# Patient Record
Sex: Female | Born: 2000 | Hispanic: Yes | Marital: Single | Smoking: Never smoker
Health system: Southern US, Community
[De-identification: ages and names within clinical notes are randomized; demographics above are authoritative.]

## PROBLEM LIST (undated history)

## (undated) DIAGNOSIS — J45909 Unspecified asthma, uncomplicated: Secondary | ICD-10-CM

---
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
## 2018-01-06 ENCOUNTER — Encounter: Payer: Self-pay | Admitting: Emergency Medicine

## 2018-01-06 ENCOUNTER — Emergency Department
Admission: EM | Admit: 2018-01-06 | Discharge: 2018-01-06 | Disposition: A | Discharge status: Home / Self Care | Payer: Medicaid Other | Attending: Emergency Medicine | Admitting: Emergency Medicine

## 2018-01-06 DIAGNOSIS — L309 Dermatitis, unspecified: Secondary | ICD-10-CM | POA: Insufficient documentation

## 2018-01-06 DIAGNOSIS — R21 Rash and other nonspecific skin eruption: Secondary | ICD-10-CM | POA: Diagnosis present

## 2018-01-06 DIAGNOSIS — J45909 Unspecified asthma, uncomplicated: Secondary | ICD-10-CM | POA: Diagnosis not present

## 2018-01-06 HISTORY — DX: Unspecified asthma, uncomplicated: J45.909

## 2018-01-06 MED ORDER — TRIAMCINOLONE ACETONIDE 0.025 % EX OINT: 1.0000 "application " | TOPICAL_OINTMENT | Freq: Two times a day (BID) | CUTANEOUS | 0 refills | Status: DC

## 2018-01-06 NOTE — ED Triage Notes (Signed)
Patient presents to ED via POV from home with c/o rash to eyes, face and arms x 2 weeks. Denies SOB or difficulty breathing. History of eczema.

## 2018-01-06 NOTE — ED Provider Notes (Signed)
[facility] Emergency Department Provider Note  ____________________________________________  Time seen: Approximately 3:49 PM  I have reviewed the triage vital signs and the nursing notes.   HISTORY  Chief Complaint Rash    HPI Molly Tyler is a 17 y.o. female presents to the emergency department with eczema along the flexural surfaces of the bilateral upper extremities and face.  Patient reports a history of eczema.  Patient reports that she has been prescribed other creams but has not been using them.  No alleviating measures have been attempted.   Past Medical History:  Diagnosis Date  . Asthma     There are no active problems to display for this patient.   History reviewed. No pertinent surgical history.  Prior to Admission medications   Medication Sig Start Date End Date Taking? Authorizing Provider  triamcinolone (KENALOG) 0.025 % ointment Apply 1 application topically 2 (two) times daily. 01/06/18   Orvil FeilWoods, Courtney Fenlon M, PA-C    Allergies Penicillins  No family history on file.  Social History Social History   Tobacco Use  . Smoking status: Never Smoker  . Smokeless tobacco: Never Used  Substance Use Topics  . Alcohol use: Never    Frequency: Never  . Drug use: Never     Review of Systems  Constitutional: No fever/chills Eyes: No visual changes. No discharge ENT: No upper respiratory complaints. Cardiovascular: no chest pain. Respiratory: no cough. No SOB. Gastrointestinal: No abdominal pain.  No nausea, no vomiting.  No diarrhea.  No constipation. Genitourinary: Negative for dysuria. No hematuria Musculoskeletal: Negative for musculoskeletal pain. Skin: Patient has a dry, erythematous rash along the flexural surfaces of the arms and face.   Neurological: Negative for headaches, focal weakness or numbness.   ____________________________________________   PHYSICAL EXAM:  VITAL SIGNS: ED Triage Vitals  Enc  Vitals Group     BP 01/06/18 1502 125/76     Pulse Rate 01/06/18 1502 62     Resp 01/06/18 1502 16     Temp 01/06/18 1502 98.4 F (36.9 C)     Temp Source 01/06/18 1502 Oral     SpO2 01/06/18 1502 98 %     Weight 01/06/18 1500 180 lb (81.6 kg)     Height 01/06/18 1500 5\' 3"  (1.6 m)     Head Circumference --      Peak Flow --      Pain Score 01/06/18 1500 0     Pain Loc --      Pain Edu? --      Excl. in GC? --      Constitutional: Alert and oriented. Well appearing and in no acute distress. Eyes: Conjunctivae are normal. PERRL. EOMI. Head: Atraumatic. Cardiovascular: Normal rate, regular rhythm. Normal S1 and S2.  Good peripheral circulation. Respiratory: Normal respiratory effort without tachypnea or retractions. Lungs CTAB. Good air entry to the bases with no decreased or absent breath sounds. Gastrointestinal: Bowel sounds 4 quadrants. Soft and nontender to palpation. No guarding or rigidity. No palpable masses. No distention. No CVA tenderness. Musculoskeletal: Full range of motion to all extremities. No gross deformities appreciated. Neurologic:  Normal speech and language. No gross focal neurologic deficits are appreciated.  Skin: Dry, erythematous rash along the flexural surfaces of the bilateral upper extremities and face visualized. Psychiatric: Mood and affect are normal. Speech and behavior are normal. Patient exhibits appropriate insight and judgement.   ____________________________________________   LABS (all labs ordered are listed, but only abnormal results are displayed)  Labs Reviewed - No data to display ____________________________________________  EKG   ____________________________________________  RADIOLOGY   No results found.  ____________________________________________    PROCEDURES  Procedure(s) performed:    Procedures    Medications - No data to display   ____________________________________________   INITIAL IMPRESSION /  ASSESSMENT AND PLAN / ED COURSE  Pertinent labs & imaging results that were available during my care of the patient were reviewed by me and considered in my medical decision making (see chart for details).  Review of the [place] CSRS was performed in accordance of the [facility] prior to dispensing any controlled drugs.     Assessment and Plan:  Eczematous dermatitis Patient presents to the emergency department with eczema of the bilateral upper upper extremities.  Patient was discharged with triamcinolone ointment and patient education regarding skin hydration was given.  Vital signs are reassuring prior to discharge.  All patient questions were answered.    ____________________________________________  FINAL CLINICAL IMPRESSION(S) / ED DIAGNOSES  Final diagnoses:  Eczema, unspecified type      NEW MEDICATIONS STARTED DURING THIS VISIT:  ED Discharge Orders        Ordered    triamcinolone (KENALOG) 0.025 % ointment  2 times daily     01/06/18 1541          This chart was dictated using voice recognition software/Dragon. Despite best efforts to proofread, errors can occur which can change the meaning. Any change was purely unintentional.    Orvil Feil, PA-C 01/06/18 1555    Arnaldo Natal, MD 01/06/18 [phone]

## 2018-01-06 NOTE — ED Notes (Signed)
Patient here for rash to face and upper extremities. Denies SOB or any breathing difficulty.

## 2018-01-06 NOTE — ED Triage Notes (Signed)
First nurse:  Says rash, itching all over for couple weeks.

## 2018-10-15 ENCOUNTER — Emergency Department: Payer: Medicaid Other

## 2018-10-15 ENCOUNTER — Other Ambulatory Visit: Payer: Self-pay

## 2018-10-15 ENCOUNTER — Emergency Department
Admission: EM | Admit: 2018-10-15 | Discharge: 2018-10-15 | Disposition: A | Discharge status: Home / Self Care | Payer: Medicaid Other | Attending: Emergency Medicine | Admitting: Emergency Medicine

## 2018-10-15 DIAGNOSIS — J111 Influenza due to unidentified influenza virus with other respiratory manifestations: Secondary | ICD-10-CM | POA: Diagnosis not present

## 2018-10-15 DIAGNOSIS — R69 Illness, unspecified: Secondary | ICD-10-CM

## 2018-10-15 DIAGNOSIS — R05 Cough: Secondary | ICD-10-CM | POA: Diagnosis present

## 2018-10-15 LAB — INFLUENZA PANEL BY PCR (TYPE A & B)
INFLAPCR: NEGATIVE
Influenza B By PCR: NEGATIVE

## 2018-10-15 MED ORDER — PSEUDOEPH-BROMPHEN-DM 30-2-10 MG/5ML PO SYRP: 5.0000 mL | ORAL_SOLUTION | Freq: Four times a day (QID) | ORAL | 0 refills | Status: DC | PRN

## 2018-10-15 MED ORDER — IBUPROFEN 600 MG PO TABS: 600.0000 mg | ORAL_TABLET | Freq: Four times a day (QID) | ORAL | 0 refills | Status: DC | PRN

## 2018-10-15 MED ORDER — BENZONATATE 100 MG PO CAPS: 100.0000 mg | ORAL_CAPSULE | Freq: Three times a day (TID) | ORAL | 0 refills | Status: AC | PRN

## 2018-10-15 MED ORDER — ALBUTEROL SULFATE HFA 108 (90 BASE) MCG/ACT IN AERS: 2.0000 | INHALATION_SPRAY | Freq: Four times a day (QID) | RESPIRATORY_TRACT | 0 refills | Status: DC | PRN

## 2018-10-15 MED ORDER — IPRATROPIUM-ALBUTEROL 0.5-2.5 (3) MG/3ML IN SOLN
3.0000 mL | Freq: Once | RESPIRATORY_TRACT | Status: AC
  Administered 2018-10-15: 3 mL via RESPIRATORY_TRACT
  Filled 2018-10-15: qty 3

## 2018-10-15 MED ORDER — FLUTICASONE PROPIONATE 50 MCG/ACT NA SUSP: 2.0000 | Freq: Every day | NASAL | 0 refills | Status: DC

## 2018-10-15 MED ORDER — ACETAMINOPHEN 500 MG PO TABS: 500.0000 mg | ORAL_TABLET | Freq: Four times a day (QID) | ORAL | 0 refills | Status: DC | PRN

## 2018-10-15 NOTE — ED Notes (Signed)
Pt states that she just does not feel good and she has been having a fever and runny nose since yesterday.

## 2018-10-15 NOTE — ED Triage Notes (Signed)
Pt c/o cough with congestion and bodyaches for the past couple of days. States yesterday she had a temp 104 denies fever today.

## 2018-10-15 NOTE — ED Triage Notes (Signed)
First Nurse Note:  C/O wheezing and cough x 1 day.  Pt is AAOx3.  No SOB/ DOE.  NAD

## 2018-10-15 NOTE — ED Provider Notes (Signed)
[facility] Emergency Department Provider Note  ____________________________________________  Time seen: Approximately 3:46 PM  I have reviewed the triage vital signs and the nursing notes.   HISTORY  Chief Complaint URI    HPI Molly Tyler is a 18 y.o. female that presents to the emergency department for evaluation of fever, chills, body aches, nasal congestion, cough nonproductive cough for 1 day.  Patient states that fever was 104 yesterday.  She vomited once this morning.  Patient has a history of asthma and is out of her inhalers.  She ran out 2 days ago.  She tried to get a refill but was told that she needs to be seen.  She denies any fever today.  No chest pain, diarrhea.  Past Medical History:  Diagnosis Date  . Asthma     There are no active problems to display for this patient.   History reviewed. No pertinent surgical history.  Prior to Admission medications   Medication Sig Start Date End Date Taking? Authorizing Provider  acetaminophen (TYLENOL) 500 MG tablet Take 1 tablet (500 mg total) by mouth every 6 (six) hours as needed. 10/15/18   Enid Derry, PA-C  albuterol (PROVENTIL HFA;VENTOLIN HFA) 108 (90 Base) MCG/ACT inhaler Inhale 2 puffs into the lungs every 6 (six) hours as needed for wheezing or shortness of breath. 10/15/18   Enid Derry, PA-C  benzonatate (TESSALON PERLES) 100 MG capsule Take 1 capsule (100 mg total) by mouth 3 (three) times daily as needed for cough. 10/15/18 10/15/19  Enid Derry, PA-C  brompheniramine-pseudoephedrine-DM 30-2-10 MG/5ML syrup Take 5 mLs by mouth 4 (four) times daily as needed. 10/15/18   Enid Derry, PA-C  fluticasone (FLONASE) 50 MCG/ACT nasal spray Place 2 sprays into both nostrils daily. 10/15/18 10/15/19  Enid Derry, PA-C  ibuprofen (ADVIL,MOTRIN) 600 MG tablet Take 1 tablet (600 mg total) by mouth every 6 (six) hours as needed. 10/15/18   Enid Derry, PA-C  triamcinolone  (KENALOG) 0.025 % ointment Apply 1 application topically 2 (two) times daily. 01/06/18   Orvil Feil, PA-C    Allergies Penicillins  No family history on file.  Social History Social History   Tobacco Use  . Smoking status: Never Smoker  . Smokeless tobacco: Never Used  Substance Use Topics  . Alcohol use: Never    Frequency: Never  . Drug use: Never     Review of Systems  Constitutional: Positive for fever. Eyes: No visual changes. No discharge. ENT: Positive for congestion and rhinorrhea. Cardiovascular: No chest pain. Respiratory: Positive for cough. No SOB. Gastrointestinal: No abdominal pain.  No nausea.  No diarrhea.  No constipation. Positive for vomiting x1.  Musculoskeletal: Positive for body aches.  Skin: Negative for rash, abrasions, lacerations, ecchymosis. Neurological: Negative for headaches.   ____________________________________________   PHYSICAL EXAM:  VITAL SIGNS: ED Triage Vitals [10/15/18 1419]  Enc Vitals Group     BP 129/60     Pulse Rate 76     Resp 17     Temp 97.8 F (36.6 C)     Temp Source Oral     SpO2 97 %     Weight 187 lb (84.8 kg)     Height 5\' 3"  (1.6 m)     Head Circumference      Peak Flow      Pain Score 6     Pain Loc      Pain Edu?      Excl. in GC?  Constitutional: Alert and oriented. Well appearing and in no acute distress. Eyes: Conjunctivae are normal. PERRL. EOMI. No discharge. Head: Atraumatic. ENT: No frontal and maxillary sinus tenderness.      Ears: Tympanic membranes pearly gray with good landmarks. No discharge.      Nose: Mild congestion/rhinnorhea.      Mouth/Throat: Mucous membranes are moist. Oropharynx non-erythematous. Tonsils not enlarged. No exudates. Uvula midline. Neck: No stridor.   Hematological/Lymphatic/Immunilogical: No cervical lymphadenopathy. Cardiovascular: Normal rate, regular rhythm.  Good peripheral circulation. Respiratory: Normal respiratory effort without tachypnea or  retractions. Lungs CTAB. Good air entry to the bases with no decreased or absent breath sounds. Gastrointestinal: Bowel sounds 4 quadrants. Soft and nontender to palpation. No guarding or rigidity. No palpable masses. No distention. Musculoskeletal: Full range of motion to all extremities. No gross deformities appreciated. Neurologic:  Normal speech and language. No gross focal neurologic deficits are appreciated.  Skin:  Skin is warm, dry and intact. No rash noted. Psychiatric: Mood and affect are normal. Speech and behavior are normal. Patient exhibits appropriate insight and judgement.   ____________________________________________   LABS (all labs ordered are listed, but only abnormal results are displayed)  Labs Reviewed  INFLUENZA PANEL BY PCR (TYPE A & B)   ____________________________________________  EKG   ____________________________________________  RADIOLOGY Lexine Baton, personally viewed and evaluated these images (plain radiographs) as part of my medical decision making, as well as reviewing the written report by the radiologist.  Dg Chest 2 View  Result Date: 10/15/2018 CLINICAL DATA:  Cough, shortness of breath, fever EXAM: CHEST - 2 VIEW COMPARISON:  None. FINDINGS: The heart size and mediastinal contours are within normal limits. Both lungs are clear. The visualized skeletal structures are unremarkable. IMPRESSION: No active cardiopulmonary disease. Electronically Signed   By: Judie Petit.  Shick M.D.   On: 10/15/2018 16:12    ____________________________________________    PROCEDURES  Procedure(s) performed:    Procedures    Medications  ipratropium-albuterol (DUONEB) 0.5-2.5 (3) MG/3ML nebulizer solution 3 mL (3 mLs Nebulization Given 10/15/18 1541)     ____________________________________________   INITIAL IMPRESSION / ASSESSMENT AND PLAN / ED COURSE  Pertinent labs & imaging results that were available during my care of the patient were reviewed  by me and considered in my medical decision making (see chart for details).  Review of the  CSRS was performed in accordance of the NCM[facility] prior to dispensing any controlled drugs.     Patient's diagnosis is consistent with influenza-like illness. Vital signs and exam are reassuring.  Influenza test is negative but symptoms are consistent with influenza.  Chest x-ray negative for acute cardiopulmonary process.  Patient felt better after DuoNeb.  Patient appears well and is staying well hydrated. Patient should alternate tylenol and ibuprofen for fever. Patient feels comfortable going home. Patient will be discharged home with prescriptions for Tylenol, Motrin, Flonase, Tessalon Perles, Bromfed, albuterol inhaler. Patient is to follow up with primary care as needed or otherwise directed. Patient is given ED precautions to return to the ED for any worsening or new symptoms.     ____________________________________________  FINAL CLINICAL IMPRESSION(S) / ED DIAGNOSES  Final diagnoses:  Influenza-like illness      NEW MEDICATIONS STARTED DURING THIS VISIT:  ED Discharge Orders         Ordered    acetaminophen (TYLENOL) 500 MG tablet  Every 6 hours PRN     10/15/18 1621    ibuprofen (ADVIL,MOTRIN) 600 MG tablet  Every 6 hours  PRN     10/15/18 1621    fluticasone (FLONASE) 50 MCG/ACT nasal spray  Daily     10/15/18 1621    benzonatate (TESSALON PERLES) 100 MG capsule  3 times daily PRN     10/15/18 1621    brompheniramine-pseudoephedrine-DM 30-2-10 MG/5ML syrup  4 times daily PRN     10/15/18 1621    albuterol (PROVENTIL HFA;VENTOLIN HFA) 108 (90 Base) MCG/ACT inhaler  Every 6 hours PRN     10/15/18 1626              This chart was dictated using voice recognition software/Dragon. Despite best efforts to proofread, errors can occur which can change the meaning. Any change was purely unintentional.    Enid DerryWagner, Malessa Zartman, PA-C 10/15/18 1830    Emily FilbertWilliams, Jonathan E,  MD 10/16/18 [phone]

## 2018-11-29 ENCOUNTER — Encounter: Payer: Self-pay | Admitting: Emergency Medicine

## 2018-11-29 ENCOUNTER — Emergency Department
Admission: EM | Admit: 2018-11-29 | Discharge: 2018-11-29 | Disposition: A | Discharge status: Home / Self Care | Payer: Medicaid Other | Attending: Emergency Medicine | Admitting: Emergency Medicine

## 2018-11-29 ENCOUNTER — Other Ambulatory Visit: Payer: Self-pay

## 2018-11-29 DIAGNOSIS — L309 Dermatitis, unspecified: Secondary | ICD-10-CM

## 2018-11-29 DIAGNOSIS — L259 Unspecified contact dermatitis, unspecified cause: Secondary | ICD-10-CM | POA: Insufficient documentation

## 2018-11-29 DIAGNOSIS — J45909 Unspecified asthma, uncomplicated: Secondary | ICD-10-CM | POA: Insufficient documentation

## 2018-11-29 DIAGNOSIS — Z79899 Other long term (current) drug therapy: Secondary | ICD-10-CM | POA: Insufficient documentation

## 2018-11-29 MED ORDER — HYDROXYZINE HCL 10 MG PO TABS: 10.0000 mg | ORAL_TABLET | Freq: Three times a day (TID) | ORAL | 0 refills | Status: AC | PRN

## 2018-11-29 NOTE — ED Provider Notes (Signed)
[facility] Emergency Department Provider Note  ____________________________________________  Time seen: Approximately 11:49 PM  I have reviewed the triage vital signs and the nursing notes.   HISTORY  Chief Complaint Allergic Reaction    HPI Molly Tyler is a 18 y.o. female presents to the emergency department with pruritus from eczema.  Patient reports that she has been taking up to 3 showers daily for the past several days.  Patient has also not been moisturizing her skin.  Patient reports that she thought about using her topical steroid cream today but forgot about it because she was at work.  No fever or chills.  No other sick contacts in the home with a similar rash.  No recent travel.  Patient denies new contact exposures with linens, detergents or make-up.  No other alleviating measures have been attempted.    Past Medical History:  Diagnosis Date  . Asthma     There are no active problems to display for this patient.   History reviewed. No pertinent surgical history.  Prior to Admission medications   Medication Sig Start Date End Date Taking? Authorizing Provider  acetaminophen (TYLENOL) 500 MG tablet Take 1 tablet (500 mg total) by mouth every 6 (six) hours as needed. 10/15/18   Enid Derry, PA-C  albuterol (PROVENTIL HFA;VENTOLIN HFA) 108 (90 Base) MCG/ACT inhaler Inhale 2 puffs into the lungs every 6 (six) hours as needed for wheezing or shortness of breath. 10/15/18   Enid Derry, PA-C  benzonatate (TESSALON PERLES) 100 MG capsule Take 1 capsule (100 mg total) by mouth 3 (three) times daily as needed for cough. 10/15/18 10/15/19  Enid Derry, PA-C  brompheniramine-pseudoephedrine-DM 30-2-10 MG/5ML syrup Take 5 mLs by mouth 4 (four) times daily as needed. 10/15/18   Enid Derry, PA-C  fluticasone (FLONASE) 50 MCG/ACT nasal spray Place 2 sprays into both nostrils daily. 10/15/18 10/15/19  Enid Derry, PA-C  hydrOXYzine  (ATARAX/VISTARIL) 10 MG tablet Take 1 tablet (10 mg total) by mouth 3 (three) times daily as needed for up to 7 days. 11/29/18 12/06/18  Orvil Feil, PA-C  ibuprofen (ADVIL,MOTRIN) 600 MG tablet Take 1 tablet (600 mg total) by mouth every 6 (six) hours as needed. 10/15/18   Enid Derry, PA-C  triamcinolone (KENALOG) 0.025 % ointment Apply 1 application topically 2 (two) times daily. 01/06/18   Orvil Feil, PA-C    Allergies Penicillins  No family history on file.  Social History Social History   Tobacco Use  . Smoking status: Never Smoker  . Smokeless tobacco: Never Used  Substance Use Topics  . Alcohol use: Never    Frequency: Never  . Drug use: Never     Review of Systems  Constitutional: No fever/chills Eyes: No visual changes. No discharge ENT: No upper respiratory complaints. Cardiovascular: no chest pain. Respiratory: no cough. No SOB. Gastrointestinal: No abdominal pain.  No nausea, no vomiting.  No diarrhea.  No constipation. Musculoskeletal: Negative for musculoskeletal pain. Skin: Patient has eczema. Neurological: Negative for headaches, focal weakness or numbness.   ____________________________________________   PHYSICAL EXAM:  VITAL SIGNS: ED Triage Vitals [11/29/18 2141]  Enc Vitals Group     BP 135/78     Pulse Rate 83     Resp 18     Temp (!) 97.5 F (36.4 C)     Temp Source Oral     SpO2 99 %     Weight 187 lb (84.8 kg)     Height 5\' 3"  (1.6  m)     Head Circumference      Peak Flow      Pain Score 0     Pain Loc      Pain Edu?      Excl. in [place]?      Constitutional: Alert and oriented. Well appearing and in no acute distress. Eyes: Conjunctivae are normal. PERRL. EOMI. Head: Atraumatic. Cardiovascular: Normal rate, regular rhythm. Normal S1 and S2.  Good peripheral circulation. Respiratory: Normal respiratory effort without tachypnea or retractions. Lungs CTAB. Good air entry to the bases with no decreased or absent breath  sounds. Musculoskeletal: Full range of motion to all extremities. No gross deformities appreciated. Neurologic:  Normal speech and language. No gross focal neurologic deficits are appreciated.  Skin: Patient has eczema along the flexural surfaces of the upper extremities, neck and mouth. Psychiatric: Mood and affect are normal. Speech and behavior are normal. Patient exhibits appropriate insight and judgement.   ____________________________________________   LABS (all labs ordered are listed, but only abnormal results are displayed)  Labs Reviewed - No data to display ____________________________________________  EKG   ____________________________________________  RADIOLOGY   No results found.  ____________________________________________    PROCEDURES  Procedure(s) performed:    Procedures    Medications - No data to display   ____________________________________________   INITIAL IMPRESSION / ASSESSMENT AND PLAN / ED COURSE  Pertinent labs & imaging results that were available during my care of the patient were reviewed by me and considered in my medical decision making (see chart for details).  Review of the [place] CSRS was performed in accordance of the [facility] prior to dispensing any controlled drugs.      Assessment and plan Eczema Patient presents to the emergency department with pruritus from eczema.  Patient education regarding abstaining from dehydrating topical products were given.  Patient was also advised to reduce her showering to once daily.  I advised increase skin hydration at home and products were discussed with patient.  Patient was discharged with hydroxyzine for pruritus.  All patient questions were answered.   ____________________________________________  FINAL CLINICAL IMPRESSION(S) / ED DIAGNOSES  Final diagnoses:  Eczema, unspecified type      NEW MEDICATIONS STARTED DURING THIS VISIT:  ED Discharge Orders         Ordered     hydrOXYzine (ATARAX/VISTARIL) 10 MG tablet  3 times daily PRN     11/29/18 2311              This chart was dictated using voice recognition software/Dragon. Despite best efforts to proofread, errors can occur which can change the meaning. Any change was purely unintentional.    Orvil Feil, PA-C 11/29/18 2352    Jeanmarie Plant, MD 12/03/18 [phone]

## 2018-11-29 NOTE — ED Triage Notes (Signed)
Patient ambulatory to triage with steady gait, without difficulty or distress noted; pt reports generalized itchy rash since this am with no known cause

## 2019-04-29 ENCOUNTER — Other Ambulatory Visit: Payer: Self-pay

## 2019-04-29 ENCOUNTER — Encounter: Payer: Self-pay | Admitting: *Deleted

## 2019-04-29 ENCOUNTER — Emergency Department
Admission: EM | Admit: 2019-04-29 | Discharge: 2019-04-30 | Disposition: A | Discharge status: Home / Self Care | Payer: Self-pay | Attending: Emergency Medicine | Admitting: Emergency Medicine

## 2019-04-29 DIAGNOSIS — J45909 Unspecified asthma, uncomplicated: Secondary | ICD-10-CM | POA: Insufficient documentation

## 2019-04-29 DIAGNOSIS — N921 Excessive and frequent menstruation with irregular cycle: Secondary | ICD-10-CM | POA: Insufficient documentation

## 2019-04-29 DIAGNOSIS — Z79899 Other long term (current) drug therapy: Secondary | ICD-10-CM | POA: Insufficient documentation

## 2019-04-29 LAB — COMPREHENSIVE METABOLIC PANEL
ALT: 22 U/L (ref 0–44)
AST: 18 U/L (ref 15–41)
Albumin: 4.6 g/dL (ref 3.5–5.0)
Alkaline Phosphatase: 55 U/L (ref 38–126)
Anion gap: 8 (ref 5–15)
BUN: 11 mg/dL (ref 6–20)
CO2: 26 mmol/L (ref 22–32)
Calcium: 9.2 mg/dL (ref 8.9–10.3)
Chloride: 103 mmol/L (ref 98–111)
Creatinine, Ser: 0.57 mg/dL (ref 0.44–1.00)
GFR calc Af Amer: >60 mL/min (ref >60)
GFR calc non Af Amer: >60 mL/min (ref >60)
Glucose, Bld: 94 mg/dL (ref 70–99)
Potassium: 3.9 mmol/L (ref 3.5–5.1)
Sodium: 137 mmol/L (ref 135–145)
Total Bilirubin: 0.6 mg/dL (ref 0.3–1.2)
Total Protein: 7.7 g/dL (ref 6.5–8.1)

## 2019-04-29 LAB — CBC
HCT: 40.7 % (ref 36.0–46.0)
Hemoglobin: 13 g/dL (ref 12.0–15.0)
MCH: 26.3 pg (ref 26.0–34.0)
MCHC: 31.9 g/dL (ref 30.0–36.0)
MCV: 82.2 fL (ref 80.0–100.0)
Platelets: 277 10*3/uL (ref 150–400)
RBC: 4.95 MIL/uL (ref 3.87–5.11)
RDW: 13 % (ref 11.5–15.5)
WBC: 8.3 10*3/uL (ref 4.0–10.5)
nRBC: 0 % (ref 0.0–0.2)

## 2019-04-29 LAB — POCT PREGNANCY, URINE: Preg Test, Ur: NEGATIVE

## 2019-04-29 LAB — URINALYSIS, COMPLETE (UACMP) WITH MICROSCOPIC
Bacteria, UA: NONE SEEN
Bilirubin Urine: NEGATIVE
Glucose, UA: NEGATIVE mg/dL
Ketones, ur: NEGATIVE mg/dL
Nitrite: NEGATIVE
Protein, ur: NEGATIVE mg/dL
Specific Gravity, Urine: 1.028 (ref 1.005–1.030)
pH: 5 (ref 5.0–8.0)

## 2019-04-29 LAB — HCG, QUANTITATIVE, PREGNANCY: hCG, Beta Chain, Quant, S: <1 m[IU]/mL (ref <5)

## 2019-04-29 LAB — LIPASE, BLOOD: Lipase: 27 U/L (ref 11–51)

## 2019-04-29 NOTE — ED Triage Notes (Signed)
Pt to ED with vaginal bleeding x 3 days. Pt finished her period last week and is concerned for the sudden return of bleeding. No blood clots. Pt denies being pregnant. LLQ pain reported with slight cramping.   Pt reporting her period Is not usually "normal" with periods in the past lasting a month. Pt was referred to an OBGYN but did not follow up.

## 2019-04-30 MED ORDER — NORGESTIMATE-ETH ESTRADIOL 0.25-35 MG-MCG PO TABS: 1.0000 | ORAL_TABLET | Freq: Every day | ORAL | 2 refills | Status: DC

## 2019-04-30 NOTE — ED Provider Notes (Signed)
[facility] Emergency Department Provider Note  ____________________________________________   First MD Initiated Contact with Patient 04/30/19 0032     (approximate)  I have reviewed the triage vital signs and the nursing notes.   HISTORY  Chief Complaint Vaginal Bleeding   The patient and/or family speak(s) Spanish.  They understand they have the right to the use of a hospital interpreter, however at this time they prefer to speak directly with me in Spanish.  They know that they can ask for an interpreter at any time.   HPI Molly Tyler is a 18 y.o. female with no documented medical history but who reports a history of irregular and occasionally heavy periods.  She presents for evaluation of vaginal bleeding x3 days.  She notes that her.  Completed last week and then she was concerned because it started back up again over the last couple of days.  She is not passing blood clots but has had a little bit of bleeding intermittently throughout the last couple of days.  While she was at work today she became dizzy and her coworker encouraged her to come to the hospital.  Her dizziness has resolved.  She has not been in contact with an attempted 19 patients.  She denies fever, sore throat, chest pain or shortness of breath, cough, nausea, vomiting, and dysuria.  She has had a little bit of cramping pain in the left lower quadrant of her abdomen.  Nothing particular makes his symptoms better or worse.  She does not take birth control pills and does not see an OB/GYN.  She does not smoke and has no history of blood clots in the legs nor the lungs.  Symptoms are mild.         Past Medical History:  Diagnosis Date   Asthma     There are no active problems to display for this patient.   History reviewed. No pertinent surgical history.  Prior to Admission medications   Medication Sig Start Date End Date Taking? Authorizing Provider  acetaminophen  (TYLENOL) 500 MG tablet Take 1 tablet (500 mg total) by mouth every 6 (six) hours as needed. 10/15/18   Enid DerryWagner, Ashley, PA-C  albuterol (PROVENTIL HFA;VENTOLIN HFA) 108 (90 Base) MCG/ACT inhaler Inhale 2 puffs into the lungs every 6 (six) hours as needed for wheezing or shortness of breath. 10/15/18   Enid DerryWagner, Ashley, PA-C  benzonatate (TESSALON PERLES) 100 MG capsule Take 1 capsule (100 mg total) by mouth 3 (three) times daily as needed for cough. 10/15/18 10/15/19  Enid DerryWagner, Ashley, PA-C  brompheniramine-pseudoephedrine-DM 30-2-10 MG/5ML syrup Take 5 mLs by mouth 4 (four) times daily as needed. 10/15/18   Enid DerryWagner, Ashley, PA-C  fluticasone (FLONASE) 50 MCG/ACT nasal spray Place 2 sprays into both nostrils daily. 10/15/18 10/15/19  Enid DerryWagner, Ashley, PA-C  ibuprofen (ADVIL,MOTRIN) 600 MG tablet Take 1 tablet (600 mg total) by mouth every 6 (six) hours as needed. 10/15/18   Enid DerryWagner, Ashley, PA-C  norgestimate-ethinyl estradiol (ORTHO-CYCLEN) 0.25-35 MG-MCG tablet Take 1 tablet by mouth daily. 04/30/19   Loleta RoseForbach, Fabion Gatson, MD  triamcinolone (KENALOG) 0.025 % ointment Apply 1 application topically 2 (two) times daily. 01/06/18   Orvil FeilWoods, Jaclyn M, PA-C    Allergies Penicillins  History reviewed. No pertinent family history.  Social History Social History   Tobacco Use   Smoking status: Never Smoker   Smokeless tobacco: Never Used  Substance Use Topics   Alcohol use: Never    Frequency: Never   Drug use:  Never    Review of Systems Constitutional: No fever/chills Eyes: No visual changes. ENT: No sore throat. Cardiovascular: Denies chest pain. Respiratory: Denies shortness of breath. Gastrointestinal: No abdominal pain.  No nausea, no vomiting.  No diarrhea.  No constipation. Genitourinary: Irregular vaginal bleeding as described above.  Negative for dysuria. Musculoskeletal: Negative for neck pain.  Negative for back pain. Integumentary: Negative for rash. Neurological: Brief episode of dizziness at work  today.  Negative for headaches, focal weakness or numbness.   ____________________________________________   PHYSICAL EXAM:  VITAL SIGNS: ED Triage Vitals  Enc Vitals Group     BP 04/29/19 1902 126/73     Pulse Rate 04/29/19 1902 67     Resp 04/29/19 1902 16     Temp 04/29/19 1902 98.2 F (36.8 C)     Temp Source 04/29/19 1902 Oral     SpO2 04/29/19 1902 100 %     Weight --      Height 04/29/19 1905 1.6 m (5\' 3" )     Head Circumference --      Peak Flow --      Pain Score 04/29/19 1904 4     Pain Loc --      Pain Edu? --      Excl. in [place]? --     Constitutional: Alert and oriented.  well-appearing in no acute distress Eyes: Conjunctivae are normal.  Head: Atraumatic. Nose: No congestion/rhinnorhea. Mouth/Throat: Mucous membranes are moist. Neck: No stridor.  No meningeal signs.   Cardiovascular: Normal rate, regular rhythm. Good peripheral circulation. Grossly normal heart sounds. Respiratory: Normal respiratory effort.  No retractions. Gastrointestinal: Soft and nondistended.  Mild tenderness to palpation of the left lower quadrant with no rebound and no guarding..  Genitourinary: Deferred Musculoskeletal: No lower extremity tenderness nor edema. No gross deformities of extremities. Neurologic:  Normal speech and language. No gross focal neurologic deficits are appreciated.  Skin:  Skin is warm, dry and intact. Psychiatric: Mood and affect are normal. Speech and behavior are normal.  ____________________________________________   LABS (all labs ordered are listed, but only abnormal results are displayed)  Labs Reviewed  URINALYSIS, COMPLETE (UACMP) WITH MICROSCOPIC - Abnormal; Notable for the following components:      Result Value   Color, Urine YELLOW (*)    APPearance HAZY (*)    Hgb urine dipstick SMALL (*)    Leukocytes,Ua MODERATE (*)    All other components within normal limits  LIPASE, BLOOD  COMPREHENSIVE METABOLIC PANEL  CBC  HCG, QUANTITATIVE,  PREGNANCY  POC URINE PREG, ED  POCT PREGNANCY, URINE   ____________________________________________  EKG  No indication for EKG ____________________________________________  RADIOLOGY I, Hinda Kehr, personally viewed and evaluated these images (plain radiographs) as part of my medical decision making, as well as reviewing the written report by the radiologist.  ED MD interpretation: No indication for imaging  Official radiology report(s): No results found.  ____________________________________________   PROCEDURES   Procedure(s) performed (including Critical Care):  Procedures   ____________________________________________   INITIAL IMPRESSION / MDM / [place] / ED COURSE  As part of my medical decision making, I reviewed the following data within the [place] notes reviewed and incorporated, Labs reviewed , Old chart reviewed and Notes from prior ED visits   Differential diagnosis includes, but is not limited to, metromenorrhagia, early pregnancy, ectopic pregnancy, fibroids.  The patient is well-appearing in no distress with stable vital signs.  Minor tenderness to palpation of  the left lower quadrant abdomen but without localized peritonitis and no other signs of acute illness.  She has a history of menometrorrhagia and is presenting with similar symptoms.  She only came in today because of a brief episode of dizziness and her coworker encouraged her to come in.  However she has normal lab work including no evidence of anemia.  I discussed additional management and planning for her and she agrees to try a course of Sprintec and will follow-up with OB/GYN.  There is no indication for emergent ultrasound at this time.  Given the lack of other complaints or concerns there is also no indication for emergent pelvic exam at this time and the patient has no concerns for STD.  I gave my usual customary return precautions.         ____________________________________________  FINAL CLINICAL IMPRESSION(S) / ED DIAGNOSES  Final diagnoses:  Menometrorrhagia     MEDICATIONS GIVEN DURING THIS VISIT:  Medications - No data to display   ED Discharge Orders         Ordered    norgestimate-ethinyl estradiol (ORTHO-CYCLEN) 0.25-35 MG-MCG tablet  Daily     04/30/19 0053          *Please note:  Molly Tyler was evaluated in Emergency Department on 04/30/2019 for the symptoms described in the history of present illness. She was evaluated in the context of the global COVID-19 pandemic, which necessitated consideration that the patient might be at risk for infection with the SARS-CoV-2 virus that causes COVID-19. Institutional protocols and algorithms that pertain to the evaluation of patients at risk for COVID-19 are in a state of rapid change based on information released by regulatory bodies including the CDC and federal and state organizations. These policies and algorithms were followed during the patient's care in the ED.  Some ED evaluations and interventions may be delayed as a result of limited staffing during the pandemic.*  Note:  This document was prepared using Dragon voice recognition software and may include unintentional dictation errors.   Loleta RoseForbach, Jermain Curt, MD 04/30/19 [phone]

## 2019-05-13 ENCOUNTER — Other Ambulatory Visit: Payer: Self-pay

## 2019-05-13 DIAGNOSIS — Z20822 Contact with and (suspected) exposure to covid-19: Secondary | ICD-10-CM

## 2019-05-14 LAB — NOVEL CORONAVIRUS, NAA: SARS-CoV-2, NAA: NOT DETECTED

## 2019-11-15 IMAGING — CR DG CHEST 2V
1 series · 2 of 2 positions shown · non-contrast
Comparison: None.

CLINICAL DATA: Cough, shortness of breath, fever

EXAM:
CHEST - 2 VIEW

[Series 1: dg chest 2 view · 0.14mm/px · 2 of 2 slices shown]
[im 1/2]
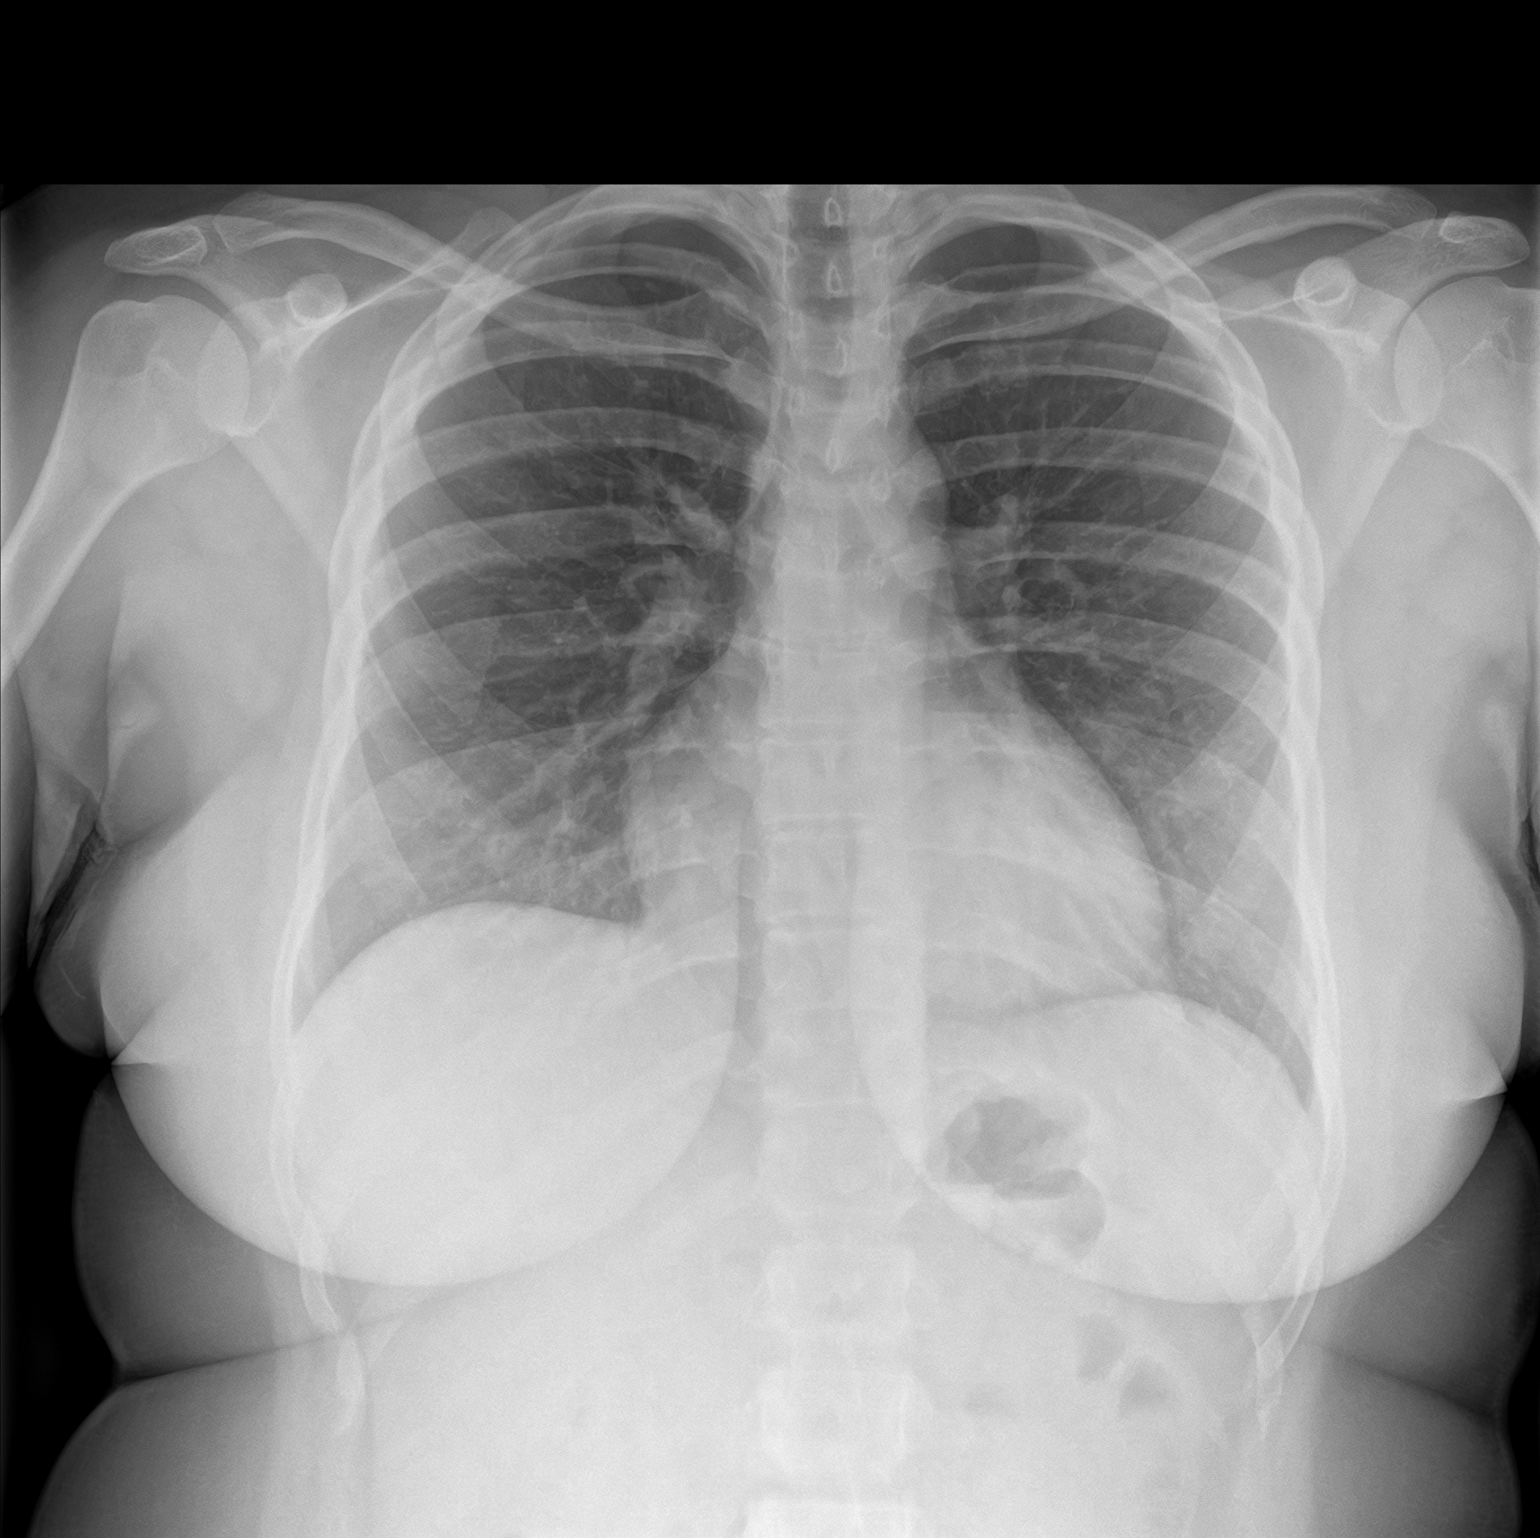
[im 2/2]
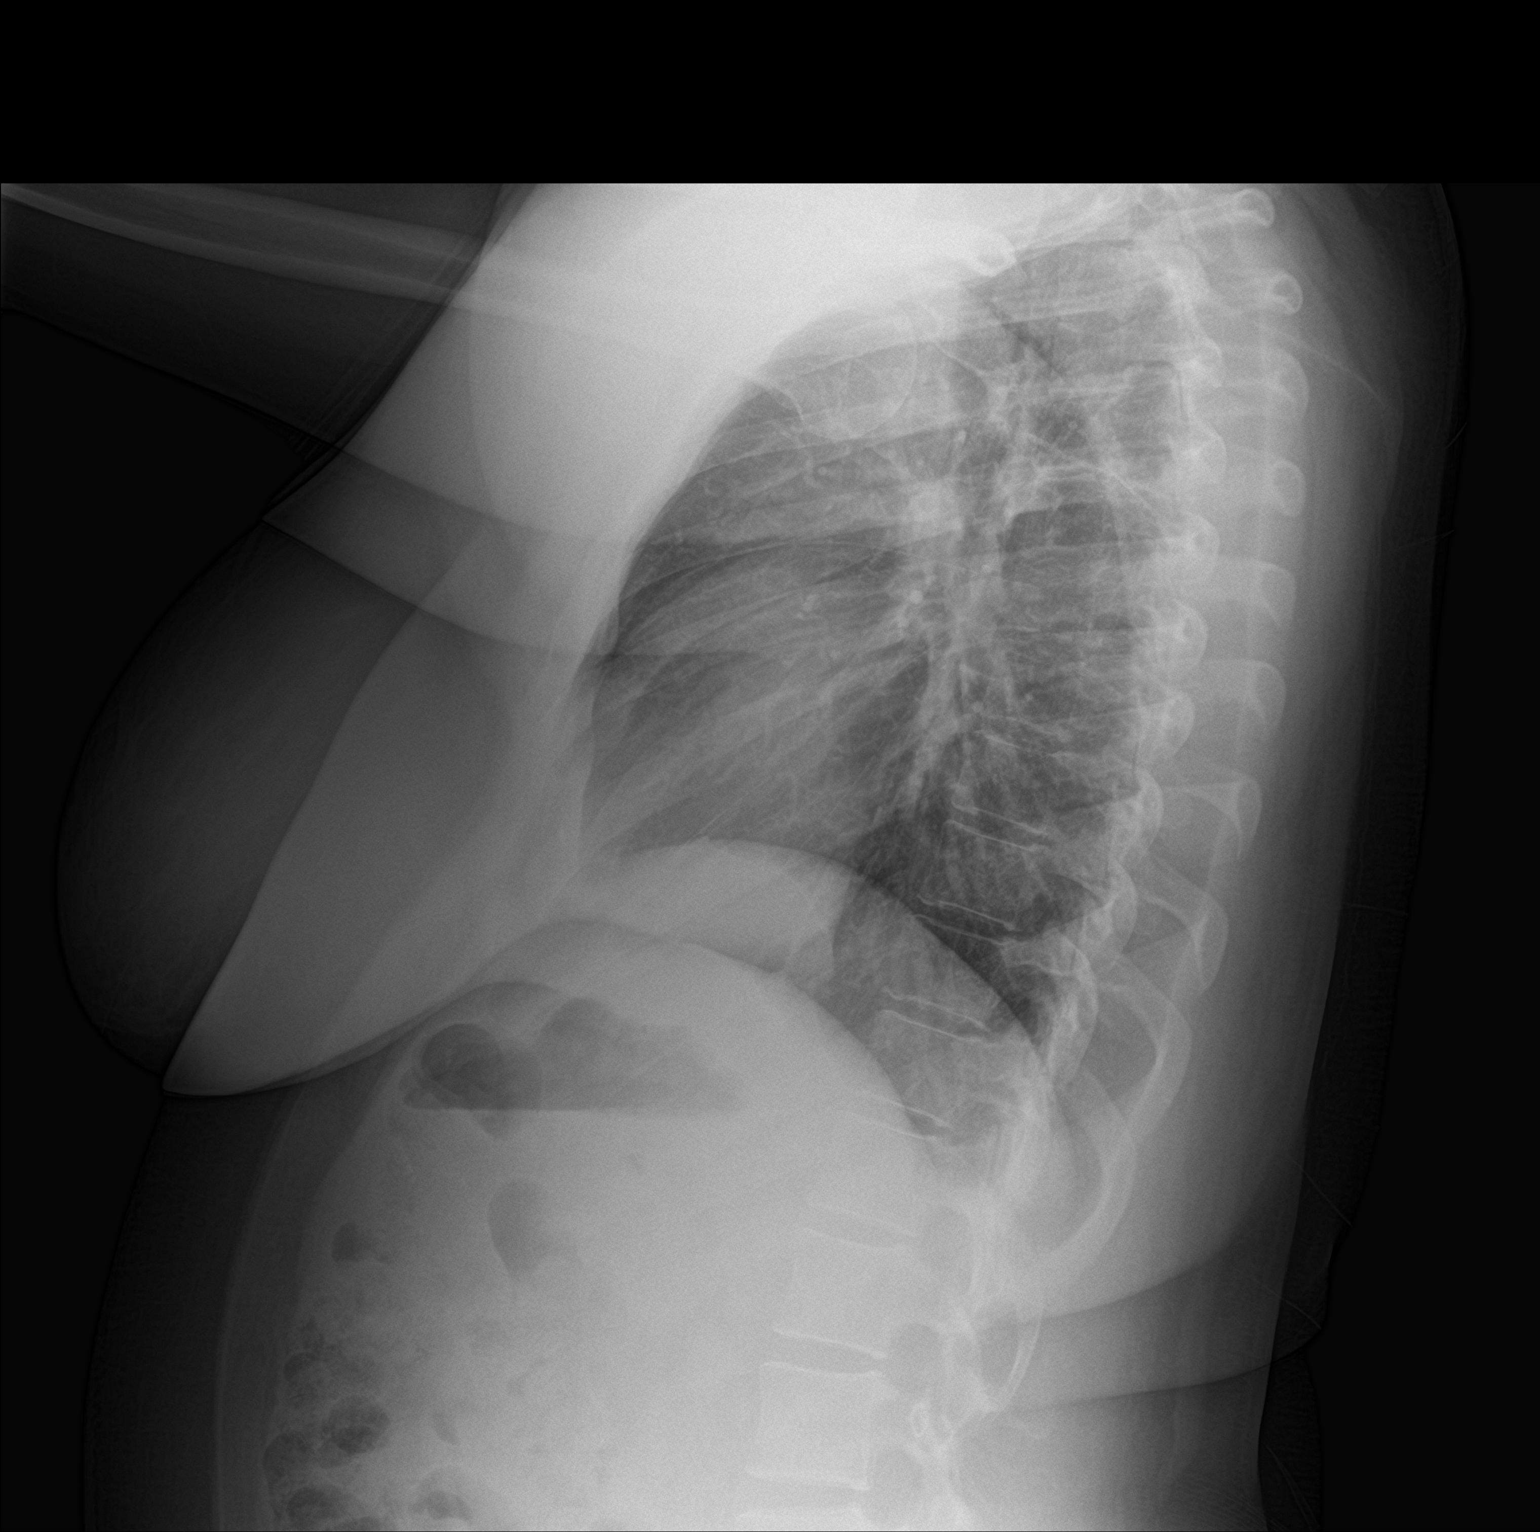

[2 of 2 positions shown; findings below may reference images not displayed]

FINDINGS: The heart size and mediastinal contours are within normal limits.
Both lungs are clear. The visualized skeletal structures are
unremarkable.
IMPRESSION: No active cardiopulmonary disease.

## 2019-12-03 IMAGING — US US EXTREM LOW VENOUS*L*
1 series · 13 of 24 positions shown · non-contrast
Comparison: None

CLINICAL DATA: LEFT calf pain



[Series 1: us extrem low venous*left* · 0.09mm/px · 13 of 36 slices shown]
[im 1/36]
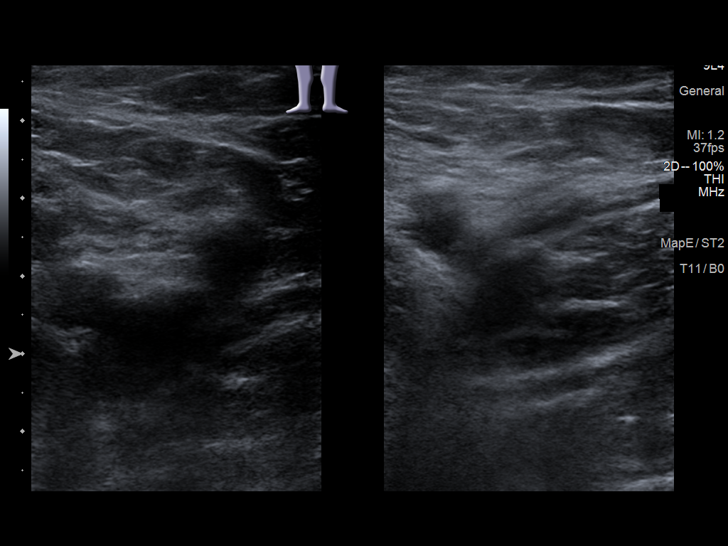
[im 4/36]
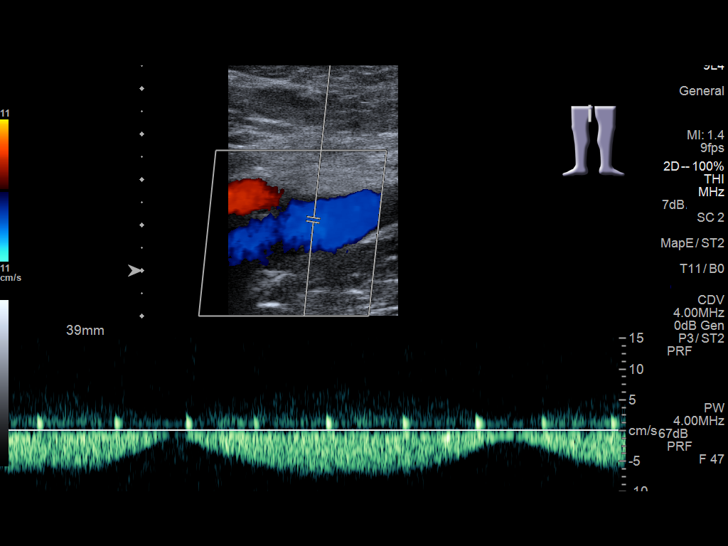
[im 7/36]
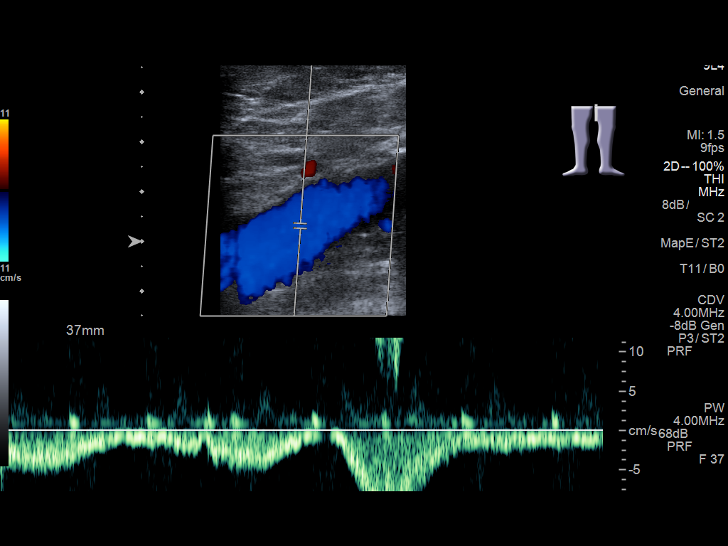
[im 10/36]
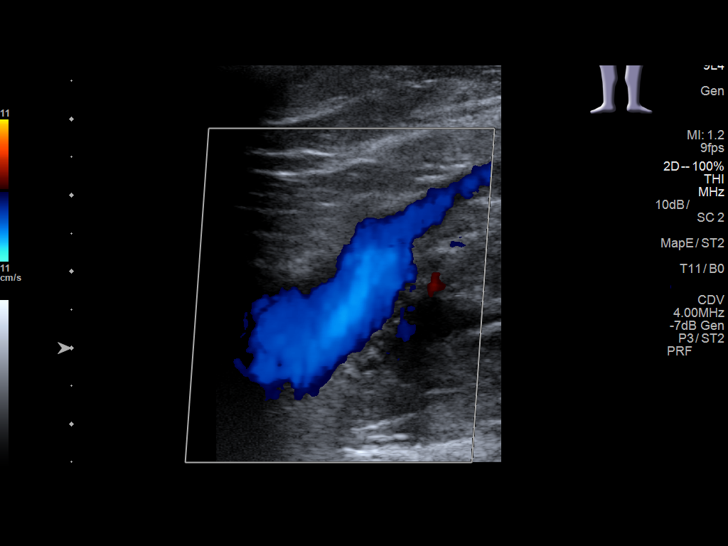
[im 13/36]
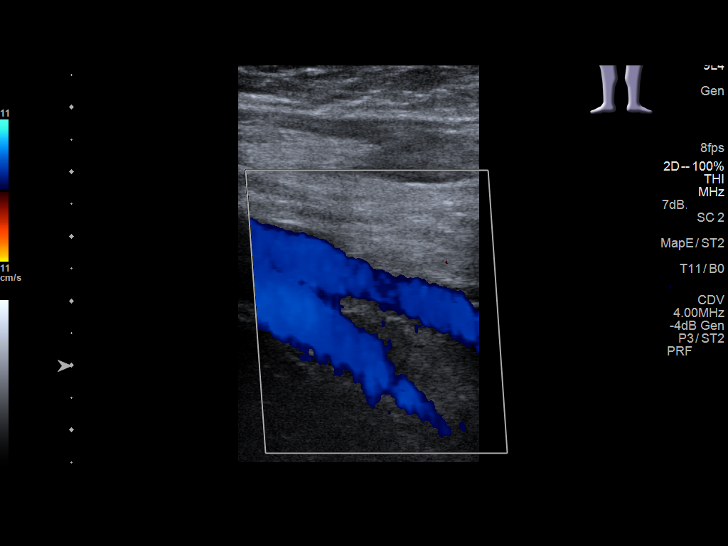
[im 16/36]
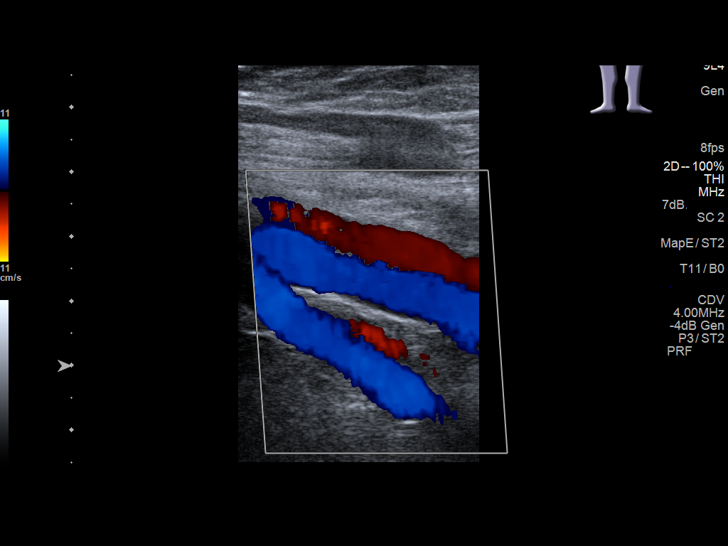
[im 19/36]
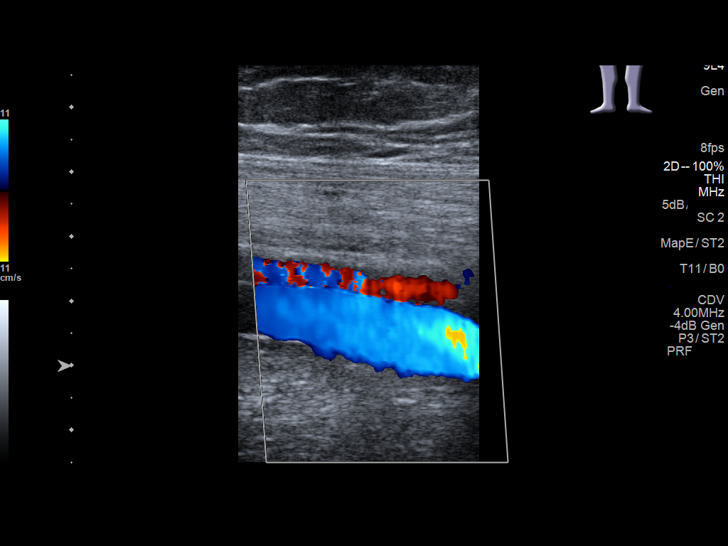
[im 20/36]
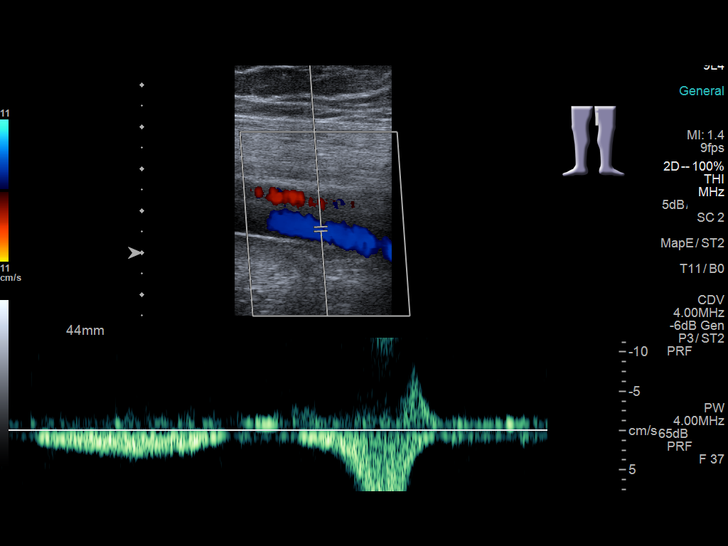
[im 23/36]
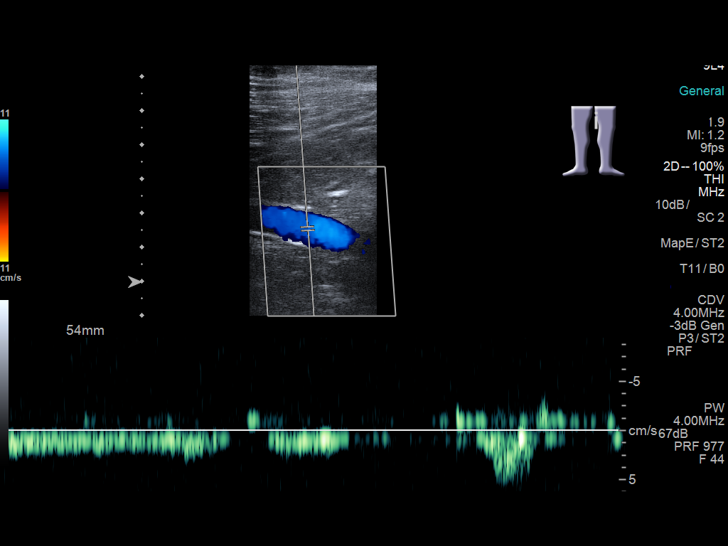
[im 26/36]
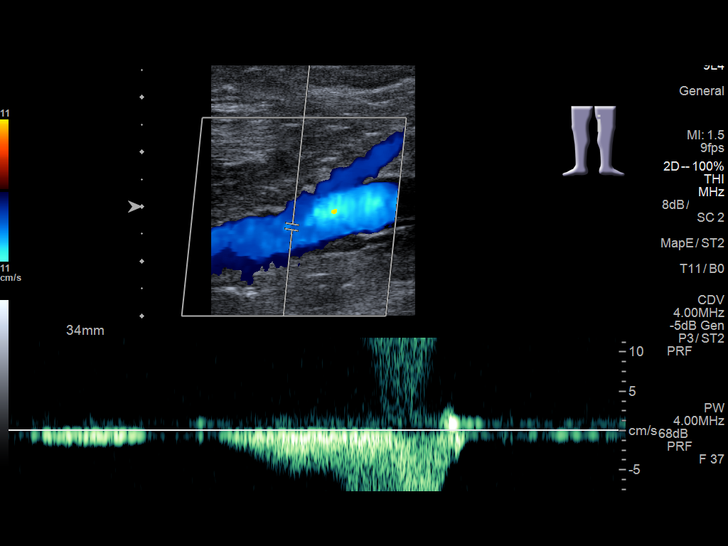
[im 29/36]
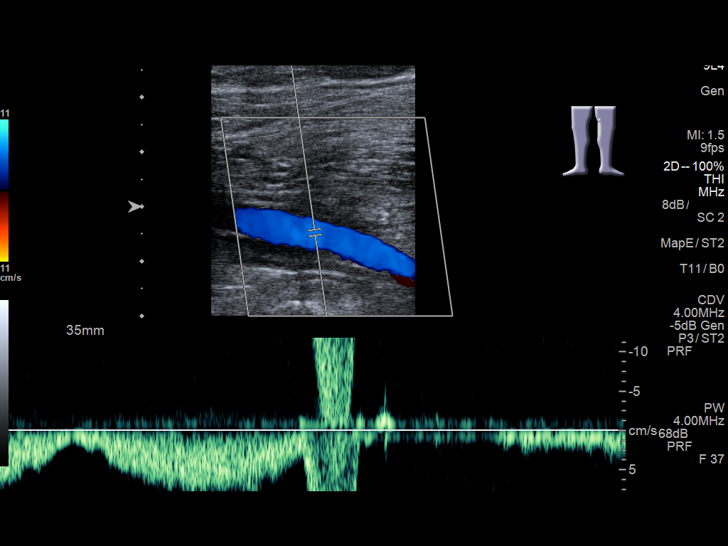
[im 32/36]
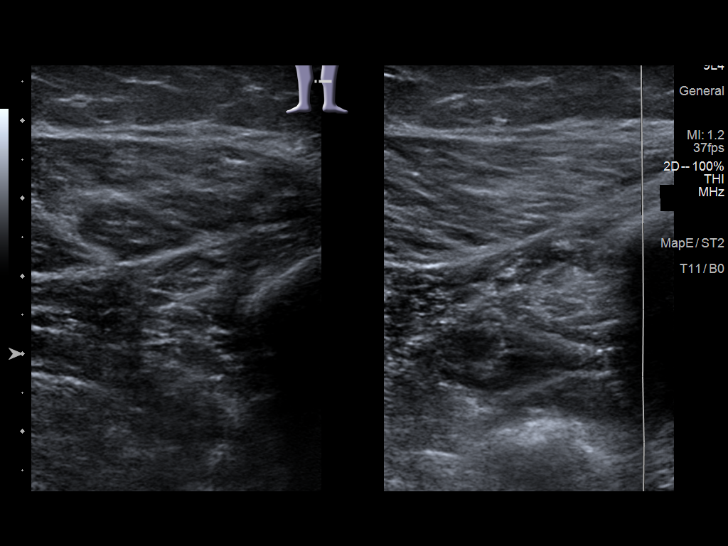
[im 36/36]
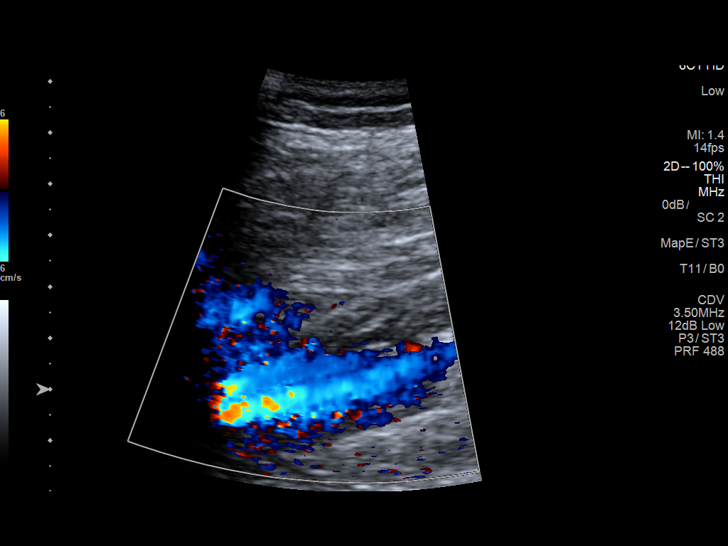

[13 of 24 positions shown; findings below may reference images not displayed]

FINDINGS: Contralateral Common Femoral Vein: Respiratory phasicity is normal
and symmetric with the symptomatic side. No evidence of thrombus.
Normal compressibility.

Common Femoral Vein: No evidence of thrombus. Normal
compressibility, respiratory phasicity and response to augmentation.

Saphenofemoral Junction: No evidence of thrombus. Normal
compressibility and flow on color Doppler imaging.

Profunda Femoral Vein: No evidence of thrombus. Normal
compressibility and flow on color Doppler imaging.

Femoral Vein: No evidence of thrombus. Normal compressibility,
respiratory phasicity and response to augmentation.

Popliteal Vein: No evidence of thrombus. Normal compressibility,
respiratory phasicity and response to augmentation.

Calf Veins: No evidence of thrombus. Normal compressibility and flow
on color Doppler imaging.

Superficial Great Saphenous Vein: No evidence of thrombus. Normal
compressibility.

Venous Reflux:  None.

Other Findings:  None.
IMPRESSION: No evidence of deep venous thrombosis in the LEFT lower extremity.

## 2021-11-26 ENCOUNTER — Other Ambulatory Visit: Payer: Self-pay

## 2021-11-26 ENCOUNTER — Emergency Department
Admission: EM | Admit: 2021-11-26 | Discharge: 2021-11-26 | Disposition: A | Discharge status: Home / Self Care | Payer: Self-pay | Attending: Student in an Organized Health Care Education/Training Program | Admitting: Student in an Organized Health Care Education/Training Program

## 2021-11-26 ENCOUNTER — Encounter: Payer: Self-pay | Admitting: Emergency Medicine

## 2021-11-26 DIAGNOSIS — K0500 Acute gingivitis, plaque induced: Secondary | ICD-10-CM | POA: Insufficient documentation

## 2021-11-26 DIAGNOSIS — J029 Acute pharyngitis, unspecified: Secondary | ICD-10-CM | POA: Insufficient documentation

## 2021-11-26 MED ORDER — CLINDAMYCIN HCL 300 MG PO CAPS: 300.0000 mg | ORAL_CAPSULE | Freq: Three times a day (TID) | ORAL | 0 refills | Status: AC

## 2021-11-26 NOTE — ED Triage Notes (Signed)
C/o feeling like something is in throat and sore gums x 3 days. ?

## 2021-11-26 NOTE — ED Provider Notes (Signed)
? ?[facility] ?Provider Note ? ? ? Event Date/Time  ? First MD Initiated Contact with Patient 11/26/21 1820   ?  (approximate) ? ? ?History  ? ?Sore Throat ? ? ?HPI ? ?Molly Tyler is a 21 y.o. female with no chronic medical history and as listed in EMR presents to the emergency department for treatment and evaluation of sore throat and painful gums.  Symptoms started about 3 days ago. ? ?  ? ? ?Physical Exam  ? ?Triage Vital Signs: ?ED Triage Vitals  ?Enc Vitals Group  ?   BP 11/26/21 1424 114/69  ?   Pulse Rate 11/26/21 1424 64  ?   Resp --   ?   Temp 11/26/21 1424 98.4 ?F (36.9 ?C)  ?   Temp src --   ?   SpO2 11/26/21 1424 100 %  ?   Weight 11/26/21 1357 186 lb 15.2 oz (84.8 kg)  ?   Height 11/26/21 1357 5\' 3"  (1.6 m)  ?   Head Circumference --   ?   Peak Flow --   ?   Pain Score 11/26/21 1357 0  ?   Pain Loc --   ?   Pain Edu? --   ?   Excl. in [place]? --   ? ? ?Most recent vital signs: ?Vitals:  ? 11/26/21 1424  ?BP: 114/69  ?Pulse: 64  ?Temp: 98.4 ?F (36.9 ?C)  ?SpO2: 100%  ? ? ?General: Awake, no distress.  ?CV:  Good peripheral perfusion.  ?Resp:  Normal effort.  ?Abd:  No distention.  ?Other:  Gingival edema over central incisors with slight recession of gums noted.  Halitosis is present.  Erythema of the posterior oropharynx without tonsillar exudate. ? ? ?ED Results / Procedures / Treatments  ? ?Labs ?(all labs ordered are listed, but only abnormal results are displayed) ?Labs Reviewed - No data to display ? ? ?EKG ? ?Not indicated ? ? ?RADIOLOGY ? ?Image and radiology report reviewed by me. ? ?Not indicated ? ?PROCEDURES: ? ?Critical Care performed: No ? ?Procedures ? ? ?MEDICATIONS ORDERED IN ED: ?Medications - No data to display ? ? ?IMPRESSION / MDM / ASSESSMENT AND PLAN / ED COURSE  ? ?I have reviewed the triage note. ? ?Differential diagnosis includes, but is not limited to, dental abscess, pharyngitis, strep throat, gingivitis ? ?21 year old female presenting to the  emergency department for treatment and evaluation of sore throat and tenderness of the gums.  See HPI for further details. ? ?On exam, she does appear to have gingivitis.  She also has significant halitosis.  There is no area of fluctuance or obvious dental abscess.  There is no associated facial swelling.  Plan will be to treat her with clindamycin as she is allergic to amoxicillin.  Community resources were provided and she intends to call to schedule a follow-up appointment with the dentist.  She is also to follow-up with her primary care provider if sore throat does not improve with the medications over the weekend.  She is to return to the emergency department for symptoms of change or worsen if she is unable to schedule appointment. ? ?  ? ? ?FINAL CLINICAL IMPRESSION(S) / ED DIAGNOSES  ? ?Final diagnoses:  ?Acute gingivitis  ?Acute pharyngitis, unspecified etiology  ? ? ? ?Rx / DC Orders  ? ?ED Discharge Orders   ? ?      Ordered  ?  clindamycin (CLEOCIN) 300 MG capsule  3 times  daily       ? 11/26/21 1905  ? ?  ?  ? ?  ? ? ? ?Note:  This document was prepared using Dragon voice recognition software and may include unintentional dictation errors. ?  ?Victorino Dike, FNP ?11/26/21 1916 ? ?  ?Carrie Mew, MD ?11/26/21 2340 ? ?

## 2021-11-26 NOTE — Discharge Instructions (Addendum)
Follow-up with the dentist when possible.  Call Monday to schedule an appointment. ? ?Follow-up with primary care if your throat pain does not improve. ? ?Take Tylenol or ibuprofen for pain as needed as directed on the packaging. ?

## 2022-01-04 ENCOUNTER — Emergency Department
Admission: EM | Admit: 2022-01-04 | Discharge: 2022-01-04 | Disposition: A | Discharge status: Home / Self Care | Payer: Self-pay | Attending: Emergency Medicine | Admitting: Emergency Medicine

## 2022-01-04 ENCOUNTER — Other Ambulatory Visit: Payer: Self-pay

## 2022-01-04 DIAGNOSIS — J069 Acute upper respiratory infection, unspecified: Secondary | ICD-10-CM | POA: Insufficient documentation

## 2022-01-04 DIAGNOSIS — Z20822 Contact with and (suspected) exposure to covid-19: Secondary | ICD-10-CM | POA: Insufficient documentation

## 2022-01-04 DIAGNOSIS — M791 Myalgia, unspecified site: Secondary | ICD-10-CM | POA: Insufficient documentation

## 2022-01-04 LAB — GROUP A STREP BY PCR: Group A Strep by PCR: NOT DETECTED

## 2022-01-04 LAB — URINALYSIS, ROUTINE W REFLEX MICROSCOPIC
Bacteria, UA: NONE SEEN
Bilirubin Urine: NEGATIVE
Glucose, UA: NEGATIVE mg/dL
Hgb urine dipstick: NEGATIVE
Ketones, ur: NEGATIVE mg/dL
Nitrite: NEGATIVE
Protein, ur: NEGATIVE mg/dL
Specific Gravity, Urine: 1.027 (ref 1.005–1.030)
pH: 5 (ref 5.0–8.0)

## 2022-01-04 LAB — RESP PANEL BY RT-PCR (FLU A&B, COVID) ARPGX2
Influenza A by PCR: NEGATIVE
Influenza B by PCR: NEGATIVE
SARS Coronavirus 2 by RT PCR: NEGATIVE

## 2022-01-04 NOTE — Discharge Instructions (Addendum)
-  Recommend Tylenol/ibuprofen as needed for body aches and fever.  You may take Mucinex as well for nasal congestion.  Recommend plan of hydration. ?-Follow-up with your primary care provider as needed if your symptoms fail to improve. ?-Return to the emergency department anytime if you begin to experience any new or worsening symptoms ?

## 2022-01-04 NOTE — ED Provider Notes (Signed)
? ?[facility] ?Provider Note ? ? ? Event Date/Time  ? First MD Initiated Contact with Patient 01/04/22 1557   ?  (approximate) ? ? ?History  ? ?Chief Complaint ?Fever ? ? ?HPI ?Molly Tyler is a 21 y.o. female, history of asthma, presents to the emergency department for evaluation of fever/body aches.  Patient states that she has been experiencing the symptoms for the past 24 hours.  No known sick exposures.  Denies chest pain, shortness of breath, abdominal pain, flank pain, nausea/vomiting, diarrhea, urinary symptoms, or dizziness/lightheadedness. ? ?History Limitations: No limitations. ? ?    ? ? ?Physical Exam  ?Triage Vital Signs: ?ED Triage Vitals  ?Enc Vitals Group  ?   BP 01/04/22 1504 130/78  ?   Pulse Rate 01/04/22 1504 95  ?   Resp 01/04/22 1504 18  ?   Temp 01/04/22 1504 97.7 ?F (36.5 ?C)  ?   Temp Source 01/04/22 1504 Oral  ?   SpO2 01/04/22 1504 95 %  ?   Weight 01/04/22 1558 186 lb 15.2 oz (84.8 kg)  ?   Height 01/04/22 1558 5\' 3"  (1.6 m)  ?   Head Circumference --   ?   Peak Flow --   ?   Pain Score 01/04/22 1502 9  ?   Pain Loc --   ?   Pain Edu? --   ?   Excl. in GC? --   ? ? ?Most recent vital signs: ?Vitals:  ? 01/04/22 1504  ?BP: 130/78  ?Pulse: 95  ?Resp: 18  ?Temp: 97.7 ?F (36.5 ?C)  ?SpO2: 95%  ? ? ?General: Awake, NAD.  ?Skin: Warm, dry. No rashes or lesions.  ?Eyes: PERRL. Conjunctivae normal. ?Neck: Normal ROM. No nuchal rigidity.  ?CV: Good peripheral perfusion.  ?Resp: Normal effort.  ?Abd: Soft, non-tender. No distention.  ?Neuro: At baseline. No gross neurological deficits.  ?MSK: No gross deformities. Normal ROM in all extremities.  ? ?Other: Mild erythema in the back of the throat. ? ?Physical Exam ? ? ? ?ED Results / Procedures / Treatments  ?Labs ?(all labs ordered are listed, but only abnormal results are displayed) ?Labs Reviewed  ?URINALYSIS, ROUTINE W REFLEX MICROSCOPIC - Abnormal; Notable for the following components:  ?    Result Value  ?  Color, Urine YELLOW (*)   ? APPearance HAZY (*)   ? Leukocytes,Ua TRACE (*)   ? All other components within normal limits  ?GROUP A STREP BY PCR  ?RESP PANEL BY RT-PCR (FLU A&B, COVID) ARPGX2  ? ? ? ?EKG ?Not applicable. ? ? ?RADIOLOGY ? ?ED Provider Interpretation: Not applicable. ? ?No results found. ? ?PROCEDURES: ? ?Critical Care performed: Not applicable. ? ?Procedures ? ? ? ?MEDICATIONS ORDERED IN ED: ?Medications - No data to display ? ? ?IMPRESSION / MDM / ASSESSMENT AND PLAN / ED COURSE  ?I reviewed the triage vital signs and the nursing notes. ?             ?               ? ?Differential diagnosis includes, but is not limited to, COVID-19, influenza, strep pharyngitis, cystitis, viral respiratory infection. ? ?ED Course ?Patient appears well.  Vitals within normal limits.  NAD. ? ?Respiratory panel negative for COVID-19 or influenza.  Group A strep PCR negative.  Urinalysis shows no evidence of infection. ? ?Assessment/Plan ?Presentation consistent with viral URI.  No indication for any further work-up or treatment.  She is currently stable at this time.  Encouraged her to take Tylenol/ibuprofen as needed for fever/body aches.  Recommended utilizing any over-the-counter medication as needed for sinus congestion.  We will plan to discharge. ? ?Provided the patient with anticipatory guidance, return precautions, and educational material. Encouraged the patient to return to the emergency department at any time if they begin to experience any new or worsening symptoms. Patient expressed understanding and agreed with the plan.  ? ?  ? ? ?FINAL CLINICAL IMPRESSION(S) / ED DIAGNOSES  ? ?Final diagnoses:  ?Viral upper respiratory tract infection  ? ? ? ?Rx / DC Orders  ? ?ED Discharge Orders   ? ? None  ? ?  ? ? ? ?Note:  This document was prepared using Dragon voice recognition software and may include unintentional dictation errors. ?  ?Varney Daily, [place] ?01/05/22 0932 ? ?  ?Jene Every, MD ?01/10/22  [phone] ? ?

## 2022-01-04 NOTE — ED Triage Notes (Signed)
Pt comes pov with body aches, fever since yesterday ?

## 2022-04-22 ENCOUNTER — Other Ambulatory Visit: Payer: Self-pay

## 2022-04-22 ENCOUNTER — Emergency Department
Admission: EM | Admit: 2022-04-22 | Discharge: 2022-04-22 | Disposition: A | Discharge status: Home / Self Care | Payer: Self-pay | Attending: Emergency Medicine | Admitting: Emergency Medicine

## 2022-04-22 DIAGNOSIS — R112 Nausea with vomiting, unspecified: Secondary | ICD-10-CM | POA: Insufficient documentation

## 2022-04-22 DIAGNOSIS — R1013 Epigastric pain: Secondary | ICD-10-CM | POA: Insufficient documentation

## 2022-04-22 LAB — URINALYSIS, ROUTINE W REFLEX MICROSCOPIC
Bilirubin Urine: NEGATIVE
Glucose, UA: NEGATIVE mg/dL
Hgb urine dipstick: NEGATIVE
Ketones, ur: NEGATIVE mg/dL
Nitrite: NEGATIVE
Protein, ur: NEGATIVE mg/dL
Specific Gravity, Urine: 1.027 (ref 1.005–1.030)
pH: 7 (ref 5.0–8.0)

## 2022-04-22 LAB — COMPREHENSIVE METABOLIC PANEL
ALT: 20 U/L (ref 0–44)
AST: 18 U/L (ref 15–41)
Albumin: 4.7 g/dL (ref 3.5–5.0)
Alkaline Phosphatase: 44 U/L (ref 38–126)
Anion gap: 6 (ref 5–15)
BUN: 22 mg/dL — ABNORMAL HIGH (ref 6–20)
CO2: 25 mmol/L (ref 22–32)
Calcium: 9.2 mg/dL (ref 8.9–10.3)
Chloride: 107 mmol/L (ref 98–111)
Creatinine, Ser: 0.81 mg/dL (ref 0.44–1.00)
GFR, Estimated: >60 mL/min (ref >60)
Glucose, Bld: 100 mg/dL — ABNORMAL HIGH (ref 70–99)
Potassium: 4.3 mmol/L (ref 3.5–5.1)
Sodium: 138 mmol/L (ref 135–145)
Total Bilirubin: 0.9 mg/dL (ref 0.3–1.2)
Total Protein: 7.5 g/dL (ref 6.5–8.1)

## 2022-04-22 LAB — CBC
HCT: 40.7 % (ref 36.0–46.0)
Hemoglobin: 13 g/dL (ref 12.0–15.0)
MCH: 25.6 pg — ABNORMAL LOW (ref 26.0–34.0)
MCHC: 31.9 g/dL (ref 30.0–36.0)
MCV: 80.3 fL (ref 80.0–100.0)
Platelets: 297 10*3/uL (ref 150–400)
RBC: 5.07 MIL/uL (ref 3.87–5.11)
RDW: 12.9 % (ref 11.5–15.5)
WBC: 6.6 10*3/uL (ref 4.0–10.5)
nRBC: 0 % (ref 0.0–0.2)

## 2022-04-22 LAB — POC URINE PREG, ED: Preg Test, Ur: NEGATIVE

## 2022-04-22 LAB — LIPASE, BLOOD: Lipase: 31 U/L (ref 11–51)

## 2022-04-22 MED ORDER — ONDANSETRON 8 MG PO TBDP: 8.0000 mg | ORAL_TABLET | Freq: Three times a day (TID) | ORAL | 0 refills | Status: DC | PRN

## 2022-04-22 MED ORDER — ONDANSETRON 4 MG PO TBDP
8.0000 mg | ORAL_TABLET | Freq: Once | ORAL | Status: AC
Start: 2022-04-22 — End: 2022-04-22
  Administered 2022-04-22: 8 mg via ORAL
  Filled 2022-04-22: qty 2

## 2022-04-22 NOTE — ED Triage Notes (Signed)
Pt here with upper abd pain and vomiting x2 days. Pt denies diarrhea and fever. Pt ambulatory to triage.

## 2022-04-22 NOTE — ED Provider Notes (Addendum)
[facility] Provider Note   Event Date/Time   First MD Initiated Contact with Patient 04/22/22 [phone]     (approximate) History  Abdominal Pain  HPI Molly Tyler is a 21 y.o. female with no stated past medical history who presents for epigastric abdominal pain that is 8/10 in severity and described as burning.  Patient Molly Tyler is associated nausea/vomiting.  Patient endorses the symptoms lasting for 2 days.  Patient denies any recent travel, sick contacts, but does state that she had some chicken that she is worried me have been "contaminated". ROS: Patient currently denies any vision changes, tinnitus, difficulty speaking, facial droop, sore throat, chest pain, shortness of breath, diarrhea, dysuria, or weakness/numbness/paresthesias in any extremity   Physical Exam  Triage Vital Signs: ED Triage Vitals  Enc Vitals Group     BP 04/22/22 0724 122/70     Pulse Rate 04/22/22 0724 62     Resp 04/22/22 0724 18     Temp 04/22/22 0724 98.7 F (37.1 C)     Temp Source 04/22/22 0724 Oral     SpO2 04/22/22 0724 97 %     Weight 04/22/22 0726 186 lb 15.2 oz (84.8 kg)     Height 04/22/22 0726 5\' 3"  (1.6 m)     Head Circumference --      Peak Flow --      Pain Score 04/22/22 0726 8     Pain Loc --      Pain Edu? --      Excl. in GC? --    Most recent vital signs: Vitals:   04/22/22 0724  BP: 122/70  Pulse: 62  Resp: 18  Temp: 98.7 F (37.1 C)  SpO2: 97%   General: Awake, oriented x4. CV:  Good peripheral perfusion.  Resp:  Normal effort.  Abd:  No distention.  Mild epigastric tenderness to palpation Other:  Young adult Hispanic overweight female laying in bed in no acute distress. ED Results / Procedures / Treatments  Labs (all labs ordered are listed, but only abnormal results are displayed) Labs Reviewed  COMPREHENSIVE METABOLIC PANEL - Abnormal; Notable for the following components:      Result Value   Glucose, Bld 100 (*)    BUN 22 (*)     All other components within normal limits  CBC - Abnormal; Notable for the following components:   MCH 25.6 (*)    All other components within normal limits  URINALYSIS, ROUTINE W REFLEX MICROSCOPIC - Abnormal; Notable for the following components:   Color, Urine YELLOW (*)    APPearance HAZY (*)    Leukocytes,Ua LARGE (*)    Bacteria, UA RARE (*)    All other components within normal limits  LIPASE, BLOOD  POC URINE PREG, ED   PROCEDURES: Critical Care performed: No .1-3 Lead EKG Interpretation  Performed by: 04/24/22, MD Authorized by: Merwyn Katos, MD     Interpretation: normal     ECG rate:  64   ECG rate assessment: normal     Rhythm: sinus rhythm     Ectopy: none     Conduction: normal    MEDICATIONS ORDERED IN ED: Medications  ondansetron (ZOFRAN-ODT) disintegrating tablet 8 mg (8 mg Oral Given 04/22/22 0858)   IMPRESSION / MDM / ASSESSMENT AND PLAN / ED COURSE  I reviewed the triage vital signs and the nursing notes.  The patient is on the cardiac monitor to evaluate for evidence of arrhythmia and/or significant heart rate changes. Patient's presentation is most consistent with acute presentation with potential threat to life or bodily function. Patient presents for acute nausea/vomiting The cause of the patients symptoms is not clear, but the patient is overall well appearing and is suspected to have a transient course of illness.  Given History and Exam there does not appear to be an emergent cause of the symptoms such as small bowel obstruction, coronary syndrome, bowel ischemia, DKA, pancreatitis, appendicitis, other acute abdomen or other emergent problem.  Reassessment: After treatment, the patient is feeling much better, tolerating PO fluids, and shows no signs of dehydration.   Disposition: Discharge home with prompt primary care physician follow up in the next 48 hours. Strict return precautions discussed.   FINAL  CLINICAL IMPRESSION(S) / ED DIAGNOSES   Final diagnoses:  Nausea and vomiting, unspecified vomiting type  Epigastric pain   Rx / DC Orders   ED Discharge Orders          Ordered    ondansetron (ZOFRAN-ODT) 8 MG disintegrating tablet  Every 8 hours PRN        04/22/22 0911           Note:  This document was prepared using Dragon voice recognition software and may include unintentional dictation errors.   Merwyn Katos, MD 04/22/22 6270    Merwyn Katos, MD 04/22/22 [phone]

## 2022-06-02 DIAGNOSIS — L2084 Intrinsic (allergic) eczema: Secondary | ICD-10-CM | POA: Diagnosis not present

## 2022-06-02 DIAGNOSIS — M791 Myalgia, unspecified site: Secondary | ICD-10-CM | POA: Diagnosis not present

## 2022-06-11 ENCOUNTER — Other Ambulatory Visit: Payer: Self-pay

## 2022-06-11 ENCOUNTER — Emergency Department
Admission: EM | Admit: 2022-06-11 | Discharge: 2022-06-11 | Disposition: A | Discharge status: Home / Self Care | Payer: 59 | Attending: Emergency Medicine | Admitting: Emergency Medicine

## 2022-06-11 DIAGNOSIS — J45909 Unspecified asthma, uncomplicated: Secondary | ICD-10-CM | POA: Diagnosis not present

## 2022-06-11 DIAGNOSIS — Z20822 Contact with and (suspected) exposure to covid-19: Secondary | ICD-10-CM | POA: Diagnosis not present

## 2022-06-11 DIAGNOSIS — R0981 Nasal congestion: Secondary | ICD-10-CM | POA: Diagnosis not present

## 2022-06-11 DIAGNOSIS — J069 Acute upper respiratory infection, unspecified: Secondary | ICD-10-CM | POA: Diagnosis not present

## 2022-06-11 DIAGNOSIS — R059 Cough, unspecified: Secondary | ICD-10-CM | POA: Diagnosis not present

## 2022-06-11 LAB — SARS CORONAVIRUS 2 BY RT PCR: SARS Coronavirus 2 by RT PCR: NEGATIVE

## 2022-06-11 LAB — GROUP A STREP BY PCR: Group A Strep by PCR: NOT DETECTED

## 2022-06-11 MED ORDER — PSEUDOEPH-BROMPHEN-DM 30-2-10 MG/5ML PO SYRP: 5.0000 mL | ORAL_SOLUTION | Freq: Four times a day (QID) | ORAL | 0 refills | Status: DC | PRN

## 2022-06-11 NOTE — ED Triage Notes (Signed)
Pt comes with c/o generalized body aches, fever and sore throat.

## 2022-06-11 NOTE — Discharge Instructions (Addendum)
Follow-up with your primary care provider if any continued problems or concerns.  Continue drinking fluids.  Tylenol if needed for body aches.  A prescription for Bromfed-DM was sent to the pharmacy which is also for cough, congestion and should be taken 4 times a day as needed.

## 2022-06-11 NOTE — ED Provider Notes (Signed)
[facility] Provider Note    Event Date/Time   First MD Initiated Contact with Patient 06/11/22 [phone]     (approximate)   History   Generalized Body Aches   HPI  Molly Tyler is a 21 y.o. female   presents to the ED with complaint of cough and congestion for the last 2 days.  Patient also has some drainage to the back of her throat.  She reports fever but is afebrile in the ED at this time.  Has a history of asthma.      Physical Exam   Triage Vital Signs: ED Triage Vitals  Enc Vitals Group     BP 06/11/22 0706 129/85     Pulse Rate 06/11/22 0706 (!) 59     Resp 06/11/22 0706 17     Temp 06/11/22 0706 98.4 F (36.9 C)     Temp src --      SpO2 06/11/22 0706 95 %     Weight --      Height --      Head Circumference --      Peak Flow --      Pain Score 06/11/22 0704 8     Pain Loc --      Pain Edu? --      Excl. in [place]? --     Most recent vital signs: Vitals:   06/11/22 0706  BP: 129/85  Pulse: (!) 59  Resp: 17  Temp: 98.4 F (36.9 C)  SpO2: 95%     General: Awake, no distress.  CV:  Good peripheral perfusion.  Heart regular rate and rhythm. Resp:  Normal effort.  Clear bilaterally. Abd:  No distention.  Other:  Nasal mucosa congested without rhinorrhea.  Posterior pharynx without erythema or exudate.  Uvula is midline.  Moderate posterior drainage.  Neck supple without adenopathy.   ED Results / Procedures / Treatments   Labs (all labs ordered are listed, but only abnormal results are displayed) Labs Reviewed  GROUP A STREP BY PCR  SARS CORONAVIRUS 2 BY RT PCR     PROCEDURES:  Critical Care performed:   Procedures   MEDICATIONS ORDERED IN ED: Medications - No data to display   IMPRESSION / MDM / [place] / ED COURSE  I reviewed the triage vital signs and the nursing notes.   Differential diagnosis includes, but is not limited to, URI, COVID, strep pharyngitis.  21 year old female  presents to the ED with complaint of cough, congestion, fever that started 2 days ago.  Patient has been taking Tylenol without relief.  Patient was made aware that her COVID and strep test are negative.  A prescription for Bromfed-DM was sent to the pharmacy for her to take as needed for symptoms and she is encouraged to drink fluids and continue with Tylenol as needed.  She is to follow-up with her PCP if any continued problems.      Patient's presentation is most consistent with acute complicated illness / injury requiring diagnostic workup.  FINAL CLINICAL IMPRESSION(S) / ED DIAGNOSES   Final diagnoses:  URI with cough and congestion     Rx / DC Orders   ED Discharge Orders          Ordered    brompheniramine-pseudoephedrine-DM 30-2-10 MG/5ML syrup  4 times daily PRN        06/11/22 0812             Note:  This document was  prepared using Conservation officer, historic buildings and may include unintentional dictation errors.   Tommi Rumps, PA-C 06/11/22 6226    Arnaldo Natal, MD 06/11/22 [phone]

## 2022-07-06 DIAGNOSIS — Z1389 Encounter for screening for other disorder: Secondary | ICD-10-CM | POA: Diagnosis not present

## 2022-07-06 DIAGNOSIS — Z23 Encounter for immunization: Secondary | ICD-10-CM | POA: Diagnosis not present

## 2022-07-06 DIAGNOSIS — N938 Other specified abnormal uterine and vaginal bleeding: Secondary | ICD-10-CM | POA: Diagnosis not present

## 2022-07-07 ENCOUNTER — Other Ambulatory Visit: Payer: Self-pay | Admitting: Primary Care

## 2022-07-07 DIAGNOSIS — N938 Other specified abnormal uterine and vaginal bleeding: Secondary | ICD-10-CM

## 2022-08-15 ENCOUNTER — Encounter: Payer: Self-pay | Admitting: Emergency Medicine

## 2022-08-15 ENCOUNTER — Other Ambulatory Visit: Payer: Self-pay

## 2022-08-15 ENCOUNTER — Emergency Department
Admission: EM | Admit: 2022-08-15 | Discharge: 2022-08-15 | Disposition: A | Discharge status: Home / Self Care | Payer: 59 | Attending: Emergency Medicine | Admitting: Emergency Medicine

## 2022-08-15 DIAGNOSIS — H1033 Unspecified acute conjunctivitis, bilateral: Secondary | ICD-10-CM | POA: Diagnosis not present

## 2022-08-15 DIAGNOSIS — J069 Acute upper respiratory infection, unspecified: Secondary | ICD-10-CM | POA: Diagnosis not present

## 2022-08-15 DIAGNOSIS — Z20822 Contact with and (suspected) exposure to covid-19: Secondary | ICD-10-CM | POA: Diagnosis not present

## 2022-08-15 DIAGNOSIS — R197 Diarrhea, unspecified: Secondary | ICD-10-CM | POA: Insufficient documentation

## 2022-08-15 DIAGNOSIS — H109 Unspecified conjunctivitis: Secondary | ICD-10-CM | POA: Insufficient documentation

## 2022-08-15 DIAGNOSIS — J029 Acute pharyngitis, unspecified: Secondary | ICD-10-CM | POA: Insufficient documentation

## 2022-08-15 DIAGNOSIS — R509 Fever, unspecified: Secondary | ICD-10-CM | POA: Diagnosis not present

## 2022-08-15 LAB — GROUP A STREP BY PCR: Group A Strep by PCR: NOT DETECTED

## 2022-08-15 LAB — RESP PANEL BY RT-PCR (FLU A&B, COVID) ARPGX2
Influenza A by PCR: NEGATIVE
Influenza B by PCR: NEGATIVE
SARS Coronavirus 2 by RT PCR: NEGATIVE

## 2022-08-15 MED ORDER — IBUPROFEN 600 MG PO TABS
600.0000 mg | ORAL_TABLET | Freq: Once | ORAL | Status: AC
  Administered 2022-08-15: 600 mg via ORAL
  Filled 2022-08-15: qty 1

## 2022-08-15 MED ORDER — ERYTHROMYCIN 5 MG/GM OP OINT: 1.0000 | TOPICAL_OINTMENT | Freq: Every day | OPHTHALMIC | 0 refills | Status: DC

## 2022-08-15 NOTE — ED Triage Notes (Addendum)
Pt here via POV with c/o of fever, nasal congestion and generalized body aches. NAD noted. Pt denies urinary s/s. Pt denies a cough.

## 2022-08-15 NOTE — ED Provider Notes (Signed)
[facility] Provider Note    Event Date/Time   First MD Initiated Contact with Patient 08/15/22 0720     (approximate)   History   Fever and Nasal Congestion   HPI  Molly Tyler is a 21 y.o. female who presents today for evaluation of runny nose, subjective fever, body aches, and sore throat for the past 2 days.  She denies any difficulty swallowing, no drooling.  She denies significant cough.  She has had loose stools, nonbloody.  No abdominal pain, chest pain, shortness of breath.  No headache or neck pain.  She also notes that when she wakes up in the morning she has conjunctival injection bilaterally and feels like her eyes are crusted shut.  Last took antipyretics at midnight.  There are no problems to display for this patient.         Physical Exam   Triage Vital Signs: ED Triage Vitals  Enc Vitals Group     BP 08/15/22 0716 123/84     Pulse Rate 08/15/22 0716 60     Resp 08/15/22 0716 18     Temp 08/15/22 0716 97.8 F (36.6 C)     Temp Source 08/15/22 0716 Oral     SpO2 08/15/22 0716 99 %     Weight 08/15/22 0719 186 lb 15.2 oz (84.8 kg)     Height 08/15/22 0719 5\' 3"  (1.6 m)     Head Circumference --      Peak Flow --      Pain Score 08/15/22 0711 6     Pain Loc --      Pain Edu? --      Excl. in GC? --     Most recent vital signs: Vitals:   08/15/22 0716 08/15/22 0913  BP: 123/84 120/80  Pulse: 60 60  Resp: 18 18  Temp: 97.8 F (36.6 C)   SpO2: 99% 99%    Physical Exam Vitals and nursing note reviewed.  Constitutional:      General: Awake and alert. No acute distress.    Appearance: Normal appearance. The patient is normal weight.  HENT:     Head: Normocephalic and atraumatic.     Mouth: Mucous membranes are moist. Uvula midline.  No tonsillar exudate.  Posterior oropharyngeal erythema.  No soft palate fluctuance.  No trismus.  No voice change.  No sublingual swelling.  No tender cervical lymphadenopathy.   No nuchal rigidity Significant nasal congestion and rhinorrhea present Eyes:     General: PERRL. Normal EOMs        Right eye: No discharge.        Left eye: No discharge.     Conjunctiva/sclera: Conjunctivae normal.  No purulent discharge.  No hyphema or hypopyon. Cardiovascular:     Rate and Rhythm: Normal rate and regular rhythm.     Pulses: Normal pulses.     Heart sounds: Normal heart sounds Pulmonary:     Effort: Pulmonary effort is normal. No respiratory distress.     Breath sounds: Normal breath sounds.  Speaking easily in complete sentences. Abdominal:     Abdomen is soft. There is no abdominal tenderness. No rebound or guarding. No distention. Musculoskeletal:        General: No swelling. Normal range of motion.     Cervical back: Normal range of motion and neck supple.  Skin:    General: Skin is warm and dry.     Capillary Refill: Capillary refill takes less than  2 seconds.     Findings: No rash.  Neurological:     Mental Status: The patient is awake and alert.      ED Results / Procedures / Treatments   Labs (all labs ordered are listed, but only abnormal results are displayed) Labs Reviewed  RESP PANEL BY RT-PCR (FLU A&B, COVID) ARPGX2  GROUP A STREP BY PCR     EKG     RADIOLOGY     PROCEDURES:  Critical Care performed:   Procedures   MEDICATIONS ORDERED IN ED: Medications  ibuprofen (ADVIL) tablet 600 mg (600 mg Oral Given 08/15/22 0806)     IMPRESSION / MDM / ASSESSMENT AND PLAN / ED COURSE  I reviewed the triage vital signs and the nursing notes.   Differential diagnosis includes, but is not limited to, URI, RSV, COVID, influenza, strep pharyngitis.  No significant cough, shortness of breath, and lung sounds are equal bilaterally, do not suspect pneumonia.  No abdominal tenderness or complaints of abdominal pain to suggest intra-abdominal infection.  No headache or nuchal rigidity or altered mental status/encephalopathy to suggest  meningitis.  She currently is afebrile and does not have any antipyretics on board. Swabs are negative.  She was given erythromycin ointment given her bilateral mucopurulent drainage, described as being stuck closed in the morning.  Currently no mucopurulent drainage, no scleral injection.  Patient reassured. Symptoms consistent with other URI. Discussed symptomatic management and return precautions.  Patient understands and agrees with plan.  Discharged in stable condition.   Patient's presentation is most consistent with acute complicated illness / injury requiring diagnostic workup.     FINAL CLINICAL IMPRESSION(S) / ED DIAGNOSES   Final diagnoses:  Upper respiratory tract infection, unspecified type  Conjunctivitis of both eyes, unspecified conjunctivitis type     Rx / DC Orders   ED Discharge Orders          Ordered    erythromycin ophthalmic ointment  Daily at bedtime        08/15/22 0905             Note:  This document was prepared using Dragon voice recognition software and may include unintentional dictation errors.   Keturah Shavers 08/15/22 1206    Loleta Rose, MD 08/15/22 [phone]

## 2022-08-15 NOTE — Discharge Instructions (Signed)
Your COVID, flu, strep test were negative.  You likely have another viral infection.  Continue Tylenol/ibuprofen per package instructions to help with your symptoms.  Please return for any new, worsening, or change in symptoms or other concerns.  It was a pleasure caring for you today.

## 2022-11-21 ENCOUNTER — Emergency Department
Admission: EM | Admit: 2022-11-21 | Discharge: 2022-11-21 | Discharge status: Against Medical Advice | Payer: 59 | Attending: Emergency Medicine | Admitting: Emergency Medicine

## 2022-11-21 ENCOUNTER — Other Ambulatory Visit: Payer: Self-pay

## 2022-11-21 DIAGNOSIS — Z5321 Procedure and treatment not carried out due to patient leaving prior to being seen by health care provider: Secondary | ICD-10-CM | POA: Insufficient documentation

## 2022-11-21 DIAGNOSIS — K0889 Other specified disorders of teeth and supporting structures: Secondary | ICD-10-CM | POA: Diagnosis present

## 2022-11-21 NOTE — ED Triage Notes (Signed)
Pt presents to ER with c/o dental pain.  Pt states she is having some pain in her right lower molars that started 3-4 days ago but got worse tonight.  Pt noted to have poor dentition in her right lower teeth.  Pt states she took tylenol appx 10 minutes ago.  Pt is otherwise A&O x4 and in NAD on arrival.

## 2022-11-21 NOTE — ED Notes (Addendum)
Pt continues to not be visualized in lobby or outside of ED. Pt called several more times for treatment room, no answer.

## 2022-11-21 NOTE — ED Notes (Signed)
Pt called for treatment room multiple times, pt not visualized in the ED, in immediately surround areas or in the bathroom.

## 2023-01-23 ENCOUNTER — Other Ambulatory Visit: Payer: Self-pay

## 2023-01-23 ENCOUNTER — Emergency Department
Admission: EM | Admit: 2023-01-23 | Discharge: 2023-01-23 | Disposition: A | Discharge status: Home / Self Care | Payer: Medicaid Other | Attending: Emergency Medicine | Admitting: Emergency Medicine

## 2023-01-23 DIAGNOSIS — L299 Pruritus, unspecified: Secondary | ICD-10-CM | POA: Diagnosis not present

## 2023-01-23 LAB — GROUP A STREP BY PCR: Group A Strep by PCR: NOT DETECTED

## 2023-01-23 MED ORDER — HYDROXYZINE HCL 10 MG/5ML PO SYRP
10.0000 mg | ORAL_SOLUTION | Freq: Once | ORAL | Status: AC
  Administered 2023-01-23: 10 mg via ORAL
  Filled 2023-01-23: qty 5

## 2023-01-23 MED ORDER — METHYLPREDNISOLONE 4 MG PO TBPK: ORAL_TABLET | ORAL | 0 refills | Status: DC

## 2023-01-23 MED ORDER — HYDROXYZINE HCL 10 MG PO TABS: 10.0000 mg | ORAL_TABLET | Freq: Three times a day (TID) | ORAL | 0 refills | Status: DC | PRN

## 2023-01-23 NOTE — ED Provider Notes (Signed)
[facility] Provider Note    Event Date/Time   First MD Initiated Contact with Patient 01/23/23 [phone]     (approximate)   History   Pruritis (X3 days)   HPI  Molly Tyler is a 22 y.o. female presents emergency department with seasonal allergies, itching for the past 3 days.  Some matting on the eyes, sore throat, runny nose.  Patient states she is taken Benadryl without any relief.  States she has eczema but it does not usually react this way.  Denies fever or chills      Physical Exam   Triage Vital Signs: ED Triage Vitals  Enc Vitals Group     BP 01/23/23 0722 (!) 129/93     Pulse Rate 01/23/23 0722 67     Resp 01/23/23 0722 18     Temp 01/23/23 0722 97.7 F (36.5 C)     Temp Source 01/23/23 0722 Oral     SpO2 01/23/23 0722 95 %     Weight --      Height 01/23/23 0723 5\' 3"  (1.6 m)     Head Circumference --      Peak Flow --      Pain Score 01/23/23 0723 0     Pain Loc --      Pain Edu? --      Excl. in GC? --     Most recent vital signs: Vitals:   01/23/23 0722  BP: (!) 129/93  Pulse: 67  Resp: 18  Temp: 97.7 F (36.5 C)  SpO2: 95%     General: Awake, no distress.   CV:  Good peripheral perfusion. regular rate and  rhythm Resp:  Normal effort. Lungs cta Abd:  No distention.   Other:  Throat is red and irritated, neck is supple, no lymphadenopathy, skin is dry with sandpaperlike texture, eczema noted at the wrist laterally   ED Results / Procedures / Treatments   Labs (all labs ordered are listed, but only abnormal results are displayed) Labs Reviewed  GROUP A STREP BY PCR     EKG     RADIOLOGY     PROCEDURES:   Procedures   MEDICATIONS ORDERED IN ED: Medications  hydrOXYzine (ATARAX) 10 MG/5ML syrup 10 mg (10 mg Oral Given 01/23/23 0806)     IMPRESSION / MDM / ASSESSMENT AND PLAN / ED COURSE  I reviewed the triage vital signs and the nursing notes.                               Differential diagnosis includes, but is not limited to, strep throat, eczema, seasonal allergies  Patient's presentation is most consistent with acute complicated illness / injury requiring diagnostic workup.   Due to the sandpaperlike rash we will do a strep test  Strep test is reassuring  I did explain the findings to the patient.  She is placed on Medrol Dosepak and Atarax.  Follow-up with her regular doctor appointment.  1 week.  Return if worsening.  She is to apply a thick lotion or Vaseline to the areas that are bothering her.  She is in agreement treatment plan.  Discharged stable condition.     FINAL CLINICAL IMPRESSION(S) / ED DIAGNOSES   Final diagnoses:  Itching     Rx / DC Orders   ED Discharge Orders          Ordered    methylPREDNISolone (MEDROL  DOSEPAK) 4 MG TBPK tablet        01/23/23 0841    hydrOXYzine (ATARAX) 10 MG tablet  3 times daily PRN        01/23/23 0841             Note:  This document was prepared using Dragon voice recognition software and may include unintentional dictation errors.    Faythe Ghee, PA-C 01/23/23 1610    Jene Every, MD 01/23/23 [phone]

## 2023-01-23 NOTE — ED Triage Notes (Signed)
Pt states allergies and itching for the last 3 days. Pt states sore throat, runny nose and itching. Pt also sneezing

## 2023-01-23 NOTE — ED Notes (Signed)
Covid and strep sent to lab

## 2023-01-23 NOTE — Discharge Instructions (Signed)
Take your medication as prescribed.  Follow-up with your regular doctor as needed.  Return for worsening

## 2023-05-22 LAB — HM PAP SMEAR

## 2023-06-12 ENCOUNTER — Emergency Department: Payer: 59

## 2023-06-12 ENCOUNTER — Other Ambulatory Visit: Payer: Self-pay

## 2023-06-12 ENCOUNTER — Emergency Department
Admission: EM | Admit: 2023-06-12 | Discharge: 2023-06-12 | Disposition: A | Discharge status: Home / Self Care | Payer: 59 | Attending: Emergency Medicine | Admitting: Emergency Medicine

## 2023-06-12 DIAGNOSIS — Z20822 Contact with and (suspected) exposure to covid-19: Secondary | ICD-10-CM | POA: Insufficient documentation

## 2023-06-12 DIAGNOSIS — J069 Acute upper respiratory infection, unspecified: Secondary | ICD-10-CM | POA: Insufficient documentation

## 2023-06-12 DIAGNOSIS — J45909 Unspecified asthma, uncomplicated: Secondary | ICD-10-CM | POA: Diagnosis not present

## 2023-06-12 DIAGNOSIS — R0602 Shortness of breath: Secondary | ICD-10-CM | POA: Diagnosis present

## 2023-06-12 DIAGNOSIS — R0600 Dyspnea, unspecified: Secondary | ICD-10-CM

## 2023-06-12 LAB — RESP PANEL BY RT-PCR (RSV, FLU A&B, COVID)  RVPGX2
Influenza A by PCR: NEGATIVE
Influenza B by PCR: NEGATIVE
Resp Syncytial Virus by PCR: NEGATIVE
SARS Coronavirus 2 by RT PCR: NEGATIVE

## 2023-06-12 MED ORDER — IPRATROPIUM-ALBUTEROL 0.5-2.5 (3) MG/3ML IN SOLN
3.0000 mL | Freq: Once | RESPIRATORY_TRACT | Status: AC
  Administered 2023-06-12: 3 mL via RESPIRATORY_TRACT
  Filled 2023-06-12: qty 3

## 2023-06-12 MED ORDER — ALBUTEROL SULFATE HFA 108 (90 BASE) MCG/ACT IN AERS: 2.0000 | INHALATION_SPRAY | Freq: Four times a day (QID) | RESPIRATORY_TRACT | 2 refills | Status: AC | PRN

## 2023-06-12 MED ORDER — PREDNISONE 20 MG PO TABS: 40.0000 mg | ORAL_TABLET | Freq: Every day | ORAL | 0 refills | Status: AC

## 2023-06-12 NOTE — ED Provider Notes (Signed)
[facility] Provider Note    Event Date/Time   First MD Initiated Contact with Patient 06/12/23 1802     (approximate)  History   Chief Complaint: Shortness of Breath and Generalized Body Aches  HPI  Molly Tyler is a 22 y.o. female with a past medical history of asthma who presents to the emergency department for cough and shortness of breath.  According to the patient over the last 3 days or so she has had cough congestion some mild shortness of breath and bodyaches.  Patient denies any chest pain.  Denies any vomiting or diarrhea.  Physical Exam   Triage Vital Signs: ED Triage Vitals  Encounter Vitals Group     BP 06/12/23 1723 135/80     Systolic BP Percentile --      Diastolic BP Percentile --      Pulse Rate 06/12/23 1723 89     Resp 06/12/23 1723 18     Temp 06/12/23 1723 99.1 F (37.3 C)     Temp Source 06/12/23 1723 Oral     SpO2 06/12/23 1723 98 %     Weight 06/12/23 1724 193 lb (87.5 kg)     Height 06/12/23 1724 5\' 3"  (1.6 m)     Head Circumference --      Peak Flow --      Pain Score 06/12/23 1724 8     Pain Loc --      Pain Education --      Exclude from Growth Chart --     Most recent vital signs: Vitals:   06/12/23 1723  BP: 135/80  Pulse: 89  Resp: 18  Temp: 99.1 F (37.3 C)  SpO2: 98%    General: Awake, no distress.  CV:  Good peripheral perfusion.  Regular rate and rhythm  Resp:  Normal effort.  Equal breath sounds bilaterally.  No obvious wheeze currently.  Does have an occasional cough during exam. Abd:  No distention.  Soft, nontender.  No rebound or guarding.  ED Results / Procedures / Treatments   EKG  EKG viewed and interpreted by myself shows normal sinus rhythm at 87 bpm with a narrow QRS, normal axis, normal intervals, no concerning ST changes.  RADIOLOGY  I have reviewed and interpreted chest x-ray images.  No consolidation on my evaluation. Radiology has read no acute finding.  Possible  nodular density in the right lower lung, likely nipple shadow.   MEDICATIONS ORDERED IN ED: Medications  ipratropium-albuterol (DUONEB) 0.5-2.5 (3) MG/3ML nebulizer solution 3 mL (has no administration in time range)  ipratropium-albuterol (DUONEB) 0.5-2.5 (3) MG/3ML nebulizer solution 3 mL (has no administration in time range)     IMPRESSION / MDM / ASSESSMENT AND PLAN / ED COURSE  I reviewed the triage vital signs and the nursing notes.  Patient's presentation is most consistent with acute presentation with potential threat to life or bodily function.  Patient presents to the emergency department for cough and congestion.  Overall the patient appears well, no distress.  Patient states a history of asthma.  Does have an occasional cough during examination but overall clear lung sounds.  Will dose a breathing treatment obtain a chest x-ray as well as a COVID/flu swab and continue to closely monitor.  Patient has a reassuring physical exam currently satting 98% on room air.  No chest pain.  No pleuritic pain.  Patient's workup is reassuring.  Given the patient's reassuring workup negative COVID/flu/RSV, reassuring chest x-ray we  will discharge the patient home with albuterol inhaler and 5 days of steroids.  Patient agreeable to plan.  Discussed return precautions.  FINAL CLINICAL IMPRESSION(S) / ED DIAGNOSES   Dyspnea Upper respiratory infection   Note:  This document was prepared using Dragon voice recognition software and may include unintentional dictation errors.   Minna Antis, MD 06/12/23 1943

## 2023-06-12 NOTE — ED Triage Notes (Signed)
Pt presents to the ED POV from home for [facility] and body aches. Pt states that these symptoms started yesterday. Pt also reports chest pain. Pt A&Ox4 at time of triage. VSS.

## 2023-06-12 NOTE — ED Notes (Signed)
See triage note  Presents with low grade fever and body aches  Also  having some SOB

## 2023-11-02 DIAGNOSIS — H5213 Myopia, bilateral: Secondary | ICD-10-CM | POA: Diagnosis not present

## 2024-01-03 ENCOUNTER — Other Ambulatory Visit: Payer: Self-pay

## 2024-01-04 ENCOUNTER — Encounter: Payer: Self-pay | Admitting: Obstetrics and Gynecology

## 2024-01-04 ENCOUNTER — Ambulatory Visit (INDEPENDENT_AMBULATORY_CARE_PROVIDER_SITE_OTHER): Admitting: Obstetrics and Gynecology

## 2024-01-04 VITALS — BP 123/85 | HR 92 | Ht 63.0 in | Wt 200.0 lb

## 2024-01-04 DIAGNOSIS — N911 Secondary amenorrhea: Secondary | ICD-10-CM | POA: Diagnosis not present

## 2024-01-04 NOTE — Progress Notes (Addendum)
 NEW GYNECOLOGY VISIT Chief Complaint  Patient presents with   New Patient (Initial Visit)     Subjective:  Aymara Cassidy Tashiro is a 23 y.o. G0P0000 presenting for secondary amenorrhea.  Reports menarche age 37. Normal monthly periods then since December, has had no period. States a couple years ago she would sometimes skip a period but never skip more than one period. She notes some hair growth on her face. No significant acne.  Had labs in August for evaluation of irregular periods (viewed in care everywhere). Labs were normal apart from elevated free testosterone.  Video interpreter used.  Gyn History: Patient's last menstrual period was 09/17/2023. Sexually active: yes/no: Yes Contraception: none History of STIs: no Last pap: 04/2023 NILM History of abnormal pap: No Periods: see above  OB History     Gravida  0   Para  0   Term  0   Preterm  0   AB  0   Living  0      SAB  0   IAB  0   Ectopic  0   Multiple  0   Live Births  0           Past Medical History:  Diagnosis Date   Asthma     History reviewed. No pertinent surgical history.  Social History   Socioeconomic History   Marital status: Married    Spouse name: Not on file   Number of children: Not on file   Years of education: Not on file   Highest education level: Not on file  Occupational History   Not on file  Tobacco Use   Smoking status: Never   Smokeless tobacco: Never  Vaping Use   Vaping status: Never Used  Substance and Sexual Activity   Alcohol use: Not Currently   Drug use: Not Currently   Sexual activity: Not Currently    Birth control/protection: None  Other Topics Concern   Not on file  Social History Narrative   Not on file   Social Drivers of Health   Financial Resource Strain: Not on file  Food Insecurity: Not on file  Transportation Needs: Not on file  Physical Activity: Not on file  Stress: Not on file  Social Connections: Not on file     Family History  Problem Relation Age of Onset   Hypertension Mother     Current Outpatient Medications on File Prior to Visit  Medication Sig Dispense Refill   acetaminophen (TYLENOL) 500 MG tablet Take 1 tablet (500 mg total) by mouth every 6 (six) hours as needed. 30 tablet 0   albuterol (VENTOLIN HFA) 108 (90 Base) MCG/ACT inhaler Inhale 2 puffs into the lungs every 6 (six) hours as needed for wheezing or shortness of breath. 8 g 2   erythromycin ophthalmic ointment Place 1 Application into both eyes at bedtime. (Patient not taking: Reported on 01/04/2024) 3.5 g 0   fluticasone (FLONASE) 50 MCG/ACT nasal spray Place 2 sprays into both nostrils daily. 16 g 0   hydrOXYzine (ATARAX) 10 MG tablet Take 1 tablet (10 mg total) by mouth 3 (three) times daily as needed. (Patient not taking: Reported on 01/04/2024) 30 tablet 0   No current facility-administered medications on file prior to visit.    Allergies  Allergen Reactions   Penicillins      Objective:   Vitals:   01/04/24 1500  BP: 123/85  Pulse: 92  Weight: 200 lb (90.7 kg)  Height: 5\' 3"  (  1.6 m)   Physical Examination:   General appearance - well appearing, and in no distress  Mental status - alert, oriented to person, place, and time  Psych:  normal mood and affect  Skin - warm and dry, normal color, no suspicious lesions noted, difficult to appreciate hirsutism reported  Extremities:  No swelling or varicosities noted  Chaperone present for exam  Assessment and Plan:  Annlee Toshiko Kemler is a 23 y.o.   1. Secondary amenorrhea (Primary) Recommend labs and pelvic ultrasound. UPT neg. Suspect PCOS. Discussed with patient. Will be in touch with results and next steps. - 17-Hydroxyprogesterone - Estradiol - Follicle stimulating hormone - Prolactin - Testosterone,Free and Total - TSH Rfx on Abnormal to Free T4 - Luteinizing hormone - Hemoglobin A1c - US PELVIC COMPLETE WITH TRANSVAGINAL; Future   No  follow-ups on file.  No future appointments.  Wanita Chamberlain, MD, FACOG Obstetrician & Gynecologist, [facility] for [facility], [facility]

## 2024-01-04 NOTE — Progress Notes (Signed)
 Pt is new to office, had Well woman exam with previous provider in Aug 2024. Pt had pap and labs at that visit.  Pt states she has not had a cycle since Dec, only some spotting since.  This is a new problem.  Pt has had negative home UPT.

## 2024-01-05 ENCOUNTER — Encounter: Payer: Self-pay | Admitting: Obstetrics and Gynecology

## 2024-01-05 LAB — HEMOGLOBIN A1C
Est. average glucose Bld gHb Est-mCnc: 111 mg/dL
Hgb A1c MFr Bld: 5.5 % (ref 4.8–5.6)

## 2024-01-10 LAB — FOLLICLE STIMULATING HORMONE: FSH: 6.7 m[IU]/mL

## 2024-01-10 LAB — 17-HYDROXYPROGESTERONE: 17-OH Progesterone LCMS: 56 ng/dL

## 2024-01-10 LAB — ESTRADIOL: Estradiol: 19.9 pg/mL

## 2024-01-10 LAB — PROLACTIN: Prolactin: 20.2 ng/mL (ref 4.8–33.4)

## 2024-01-10 LAB — TESTOSTERONE,FREE AND TOTAL
Testosterone, Free: 4.6 pg/mL — ABNORMAL HIGH (ref 0.0–4.2)
Testosterone: 56 ng/dL (ref 13–71)

## 2024-01-10 LAB — LUTEINIZING HORMONE: LH: 12 m[IU]/mL

## 2024-01-10 LAB — TSH RFX ON ABNORMAL TO FREE T4: TSH: 0.725 u[IU]/mL (ref 0.450–4.500)

## 2024-01-12 ENCOUNTER — Ambulatory Visit (HOSPITAL_COMMUNITY)
Admission: RE | Admit: 2024-01-12 | Discharge: 2024-01-12 | Disposition: A | Discharge status: Home / Self Care | Source: Ambulatory Visit | Attending: Obstetrics and Gynecology | Admitting: Obstetrics and Gynecology

## 2024-01-12 ENCOUNTER — Other Ambulatory Visit: Payer: Self-pay | Admitting: Obstetrics and Gynecology

## 2024-01-12 DIAGNOSIS — N911 Secondary amenorrhea: Secondary | ICD-10-CM | POA: Insufficient documentation

## 2024-01-22 ENCOUNTER — Encounter: Payer: Self-pay | Admitting: Obstetrics and Gynecology

## 2024-01-22 ENCOUNTER — Other Ambulatory Visit: Payer: Self-pay | Admitting: Obstetrics and Gynecology

## 2024-01-22 DIAGNOSIS — N911 Secondary amenorrhea: Secondary | ICD-10-CM

## 2024-02-29 ENCOUNTER — Ambulatory Visit (INDEPENDENT_AMBULATORY_CARE_PROVIDER_SITE_OTHER): Admitting: Family Medicine

## 2024-02-29 ENCOUNTER — Encounter: Payer: Self-pay | Admitting: Family Medicine

## 2024-02-29 VITALS — BP 124/86 | HR 59 | Temp 97.9°F | Ht 63.0 in | Wt 204.0 lb

## 2024-02-29 DIAGNOSIS — Z114 Encounter for screening for human immunodeficiency virus [HIV]: Secondary | ICD-10-CM | POA: Diagnosis not present

## 2024-02-29 DIAGNOSIS — L309 Dermatitis, unspecified: Secondary | ICD-10-CM | POA: Diagnosis not present

## 2024-02-29 DIAGNOSIS — Z1159 Encounter for screening for other viral diseases: Secondary | ICD-10-CM

## 2024-02-29 DIAGNOSIS — J4520 Mild intermittent asthma, uncomplicated: Secondary | ICD-10-CM

## 2024-02-29 DIAGNOSIS — Z7689 Persons encountering health services in other specified circumstances: Secondary | ICD-10-CM

## 2024-02-29 DIAGNOSIS — E6609 Other obesity due to excess calories: Secondary | ICD-10-CM

## 2024-02-29 DIAGNOSIS — E66812 Obesity, class 2: Secondary | ICD-10-CM

## 2024-02-29 DIAGNOSIS — Z6836 Body mass index (BMI) 36.0-36.9, adult: Secondary | ICD-10-CM | POA: Diagnosis not present

## 2024-02-29 LAB — CBC WITH DIFFERENTIAL/PLATELET
Basophils Absolute: 0 10*3/uL (ref 0.0–0.1)
Basophils Relative: 0.4 % (ref 0.0–3.0)
Eosinophils Absolute: 0.6 10*3/uL (ref 0.0–0.7)
Eosinophils Relative: 7.7 % — ABNORMAL HIGH (ref 0.0–5.0)
HCT: 41.6 % (ref 36.0–46.0)
Hemoglobin: 13.5 g/dL (ref 12.0–15.0)
Lymphocytes Relative: 31.6 % (ref 12.0–46.0)
Lymphs Abs: 2.3 10*3/uL (ref 0.7–4.0)
MCHC: 32.5 g/dL (ref 30.0–36.0)
MCV: 80.7 fl (ref 78.0–100.0)
Monocytes Absolute: 0.5 10*3/uL (ref 0.1–1.0)
Monocytes Relative: 7 % (ref 3.0–12.0)
Neutro Abs: 3.9 10*3/uL (ref 1.4–7.7)
Neutrophils Relative %: 53.3 % (ref 43.0–77.0)
Platelets: 271 10*3/uL (ref 150.0–400.0)
RBC: 5.15 Mil/uL — ABNORMAL HIGH (ref 3.87–5.11)
RDW: 13.1 % (ref 11.5–15.5)
WBC: 7.3 10*3/uL (ref 4.0–10.5)

## 2024-02-29 NOTE — Progress Notes (Signed)
 New Patient Office Visit  Subjective   Patient ID: Molly Tyler, female    DOB: 11-10-2000  Age: 23 y.o. MRN: [id number]  CC:  Chief Complaint  Patient presents with   Establish Care    HPI Molly Tyler presents to establish care with new provider.   Patients previous primary care in [facility] Hunt Magyar, NP.   Specialist: [facility] for [facility] with Dr. Fenton House.   Asthma: Intermittent. Patient is prescribed Albuterol  inhaler 2 puffs every 6hrs PRN. She reports was prescribed this medication when she had PNA. Use it rarely.   Eczema: Intermittent. Patient is prescribed Triamcinolone  ointment 2 times a day. Patient reports it will sometimes help depending on the flare up. Currently, not effective.   Patient is complaining of obesity. Patient reports she has tried to work on diet with less sugar, carbohydrates. Exercise: Walk 1.5 hour, daily in the gym. No weight loss. Never took medications for weight loss.  Outpatient Encounter Medications as of 02/29/2024  Medication Sig   albuterol  (VENTOLIN  HFA) 108 (90 Base) MCG/ACT inhaler Inhale 2 puffs into the lungs every 6 (six) hours as needed for wheezing or shortness of breath.   triamcinolone  ointment (KENALOG ) 0.1 % Apply 1 Application topically 2 (two) times daily.   [DISCONTINUED] acetaminophen  (TYLENOL ) 500 MG tablet Take 1 tablet (500 mg total) by mouth every 6 (six) hours as needed.   [DISCONTINUED] erythromycin  ophthalmic ointment Place 1 Application into both eyes at bedtime. (Patient not taking: Reported on 01/04/2024)   [DISCONTINUED] fluticasone  (FLONASE ) 50 MCG/ACT nasal spray Place 2 sprays into both nostrils daily.   [DISCONTINUED] hydrOXYzine  (ATARAX ) 10 MG tablet Take 1 tablet (10 mg total) by mouth 3 (three) times daily as needed. (Patient not taking: Reported on 01/04/2024)   No facility-administered encounter medications on file as of 02/29/2024.     Past Medical History:  Diagnosis Date   Asthma     History reviewed. No pertinent surgical history.  Family History  Problem Relation Age of Onset   Hypertension Mother     Social History   Socioeconomic History   Marital status: Married    Spouse name: Not on file   Number of children: 0   Years of education: Not on file   Highest education level: High school graduate  Occupational History   Not on file  Tobacco Use   Smoking status: Never   Smokeless tobacco: Never  Vaping Use   Vaping status: Never Used  Substance and Sexual Activity   Alcohol use: Not Currently   Drug use: Not Currently   Sexual activity: Not Currently    Birth control/protection: None  Other Topics Concern   Not on file  Social History Narrative   Not on file   Social Drivers of Health   Financial Resource Strain: Low Risk  (02/29/2024)   Overall Financial Resource Strain (CARDIA)    Difficulty of Paying Living Expenses: Not hard at all  Food Insecurity: No Food Insecurity (02/29/2024)   Hunger Vital Sign    Worried About Running Out of Food in the Last Year: Never true    Ran Out of Food in the Last Year: Never true  Transportation Needs: No Transportation Needs (02/29/2024)   PRAPARE - Administrator, Civil Service (Medical): No    Lack of Transportation (Non-Medical): No  Physical Activity: Sufficiently Active (02/29/2024)   Exercise Vital Sign    Days of Exercise per  Week: 7 days    Minutes of Exercise per Session: 50 min  Stress: Stress Concern Present (02/29/2024)   [employer] of Occupational Health - Occupational Stress Questionnaire    Feeling of Stress : To some extent  Social Connections: Moderately Isolated (02/29/2024)   Social Connection and Isolation Panel [NHANES]    Frequency of Communication with Friends and Family: Three times a week    Frequency of Social Gatherings with Friends and Family: Never    Attends Religious Services: Never    Doctor, general practice or Organizations: No    Attends Banker Meetings: Never    Marital Status: Married  Catering manager Violence: Not At Risk (05/22/2023)   Received from [facility]   Humiliation, Afraid, Rape, and Kick questionnaire    Fear of Current or Ex-Partner: No    Emotionally Abused: No    Physically Abused: No    Sexually Abused: No    ROS See HPI above    Objective  BP 124/86   Pulse (!) 59   Temp 97.9 F (36.6 C) (Oral)   Ht 5\' 3"  (1.6 m)   Wt 204 lb (92.5 kg)   LMP 02/09/2024 (Exact Date)   SpO2 98%   BMI 36.14 kg/m   Physical Exam Vitals reviewed.  Constitutional:      General: She is not in acute distress.    Appearance: Normal appearance. She is obese. She is not ill-appearing, toxic-appearing or diaphoretic.  HENT:     Head: Normocephalic and atraumatic.  Eyes:     General:        Right eye: No discharge.        Left eye: No discharge.     Conjunctiva/sclera: Conjunctivae normal.  Cardiovascular:     Rate and Rhythm: Normal rate and regular rhythm.     Heart sounds: Normal heart sounds. No murmur heard.    No friction rub. No gallop.  Pulmonary:     Effort: Pulmonary effort is normal. No respiratory distress.     Breath sounds: Normal breath sounds.  Musculoskeletal:        General: Normal range of motion.  Skin:    General: Skin is warm and dry.     Findings: Rash (On right hand-ezcema) present.  Neurological:     General: No focal deficit present.     Mental Status: She is alert and oriented to person, place, and time. Mental status is at baseline.  Psychiatric:        Mood and Affect: Mood normal.        Behavior: Behavior normal.        Thought Content: Thought content normal.        Judgment: Judgment normal.       Assessment & Plan:  Eczema, unspecified type -     Ambulatory referral to Dermatology  Class 2 obesity due to excess calories without serious comorbidity with body mass index (BMI) of 36.0 to 36.9 in adult -      CBC with Differential/Platelet -     Comprehensive metabolic panel with GFR  Mild intermittent asthma without complication  Encounter for screening for HIV -     HIV Antibody (routine testing w rflx)  Need for hepatitis C screening test -     Hepatitis C antibody  Encounter to establish care   1.Review health maintenance: -Covid boosters: Declines  -HIV and Hep C screening: Ordered  -HPV vaccines: Declines  2.Ordered CBC and  CMP based on obesity. Did not do lipids since patient was not fasting. Had normal TSH and A1c level on 01/04/2024.  3.Placed a referral to dermatology for eczema. Continue using Triamcinolone  cream until seen by dermatology.  4.Continue with Albuterol  inhaler as needed for asthma.  5.Please get in contact with your insurance company to find out which medications your insurance plan will cover. Please send a MyChart message with a list. Will follow up based on list. Continue with healthy diet and regular exercise.   6.Spanish interpreter: Ana    Allard Lightsey, NP

## 2024-02-29 NOTE — Patient Instructions (Addendum)
-  It was nice to meet you and look forward to taking care of you.  -Ordered labs. Office will call with lab results.  -Placed a referral to dermatology for eczema. Continue using Triamcinolone  cream until seen by dermatology. Please call the office or send a MyChart message if you do not receive a phone call or a MyChart message about appointment in 2 weeks.  -Continue with Albuterol  inhaler as needed for asthma.  -Please get in contact with your insurance company to find out which medications your insurance plan will cover. Please send a MyChart message with a list. Will follow up based on list. Continue with healthy diet and regular exercise.  -Take care!

## 2024-03-01 ENCOUNTER — Ambulatory Visit: Payer: Self-pay | Admitting: Family Medicine

## 2024-03-01 LAB — HEPATITIS C ANTIBODY: Hepatitis C Ab: NONREACTIVE

## 2024-03-01 LAB — COMPREHENSIVE METABOLIC PANEL WITH GFR
ALT: 25 U/L (ref 0–35)
AST: 16 U/L (ref 0–37)
Albumin: 4.7 g/dL (ref 3.5–5.2)
Alkaline Phosphatase: 58 U/L (ref 39–117)
BUN: 15 mg/dL (ref 6–23)
CO2: 24 meq/L (ref 19–32)
Calcium: 9.8 mg/dL (ref 8.4–10.5)
Chloride: 104 meq/L (ref 96–112)
Creatinine, Ser: 0.71 mg/dL (ref 0.40–1.20)
GFR: 118.3 mL/min (ref >60.00)
Glucose, Bld: 86 mg/dL (ref 70–99)
Potassium: 4.1 meq/L (ref 3.5–5.1)
Sodium: 139 meq/L (ref 135–145)
Total Bilirubin: 0.3 mg/dL (ref 0.2–1.2)
Total Protein: 7.7 g/dL (ref 6.0–8.3)

## 2024-03-01 LAB — HIV ANTIBODY (ROUTINE TESTING W REFLEX): HIV 1&2 Ab, 4th Generation: NONREACTIVE

## 2024-03-06 ENCOUNTER — Ambulatory Visit: Admitting: Family Medicine

## 2024-03-07 ENCOUNTER — Ambulatory Visit: Admitting: Family Medicine

## 2024-03-25 ENCOUNTER — Ambulatory Visit: Admitting: Family Medicine

## 2024-05-16 ENCOUNTER — Ambulatory Visit: Admitting: Family Medicine

## 2024-10-30 ENCOUNTER — Ambulatory Visit: Admitting: Physician Assistant
